# Patient Record
Sex: Female | Born: 1975 | Hispanic: Yes | Marital: Married | Smoking: Never smoker
Health system: Southern US, Community
[De-identification: ages and names within clinical notes are randomized; demographics above are authoritative.]

## PROBLEM LIST (undated history)

## (undated) DIAGNOSIS — D649 Anemia, unspecified: Secondary | ICD-10-CM

## (undated) DIAGNOSIS — F32A Depression, unspecified: Secondary | ICD-10-CM

## (undated) HISTORY — DX: Anemia, unspecified: D64.9

## (undated) HISTORY — DX: Depression, unspecified: F32.A

---

## 2004-10-22 ENCOUNTER — Inpatient Hospital Stay: Payer: Self-pay | Admitting: Obstetrics and Gynecology

## 2004-12-08 ENCOUNTER — Ambulatory Visit: Payer: Self-pay | Admitting: Obstetrics and Gynecology

## 2005-02-25 ENCOUNTER — Ambulatory Visit: Payer: Self-pay | Admitting: Obstetrics and Gynecology

## 2005-05-06 ENCOUNTER — Ambulatory Visit: Payer: Self-pay | Admitting: Obstetrics and Gynecology

## 2005-09-16 ENCOUNTER — Inpatient Hospital Stay: Payer: Self-pay | Admitting: Obstetrics and Gynecology

## 2007-02-20 ENCOUNTER — Emergency Department: Payer: Self-pay | Admitting: Emergency Medicine

## 2007-11-15 ENCOUNTER — Ambulatory Visit: Payer: Self-pay | Admitting: Obstetrics and Gynecology

## 2007-11-23 ENCOUNTER — Emergency Department: Payer: Self-pay | Admitting: Emergency Medicine

## 2007-11-24 ENCOUNTER — Ambulatory Visit: Payer: Self-pay | Admitting: Emergency Medicine

## 2007-12-27 ENCOUNTER — Encounter: Payer: Self-pay | Admitting: Maternal & Fetal Medicine

## 2008-01-01 ENCOUNTER — Ambulatory Visit: Payer: Self-pay | Admitting: Obstetrics and Gynecology

## 2008-01-14 ENCOUNTER — Ambulatory Visit: Payer: Self-pay | Admitting: Obstetrics and Gynecology

## 2008-01-17 ENCOUNTER — Emergency Department: Payer: Self-pay | Admitting: Emergency Medicine

## 2008-01-30 ENCOUNTER — Inpatient Hospital Stay: Payer: Self-pay | Admitting: Obstetrics and Gynecology

## 2008-03-21 ENCOUNTER — Emergency Department: Payer: Self-pay | Admitting: Emergency Medicine

## 2008-03-25 ENCOUNTER — Inpatient Hospital Stay: Payer: Self-pay | Admitting: Obstetrics and Gynecology

## 2008-06-09 ENCOUNTER — Encounter: Payer: Self-pay | Admitting: Obstetrics and Gynecology

## 2008-06-30 ENCOUNTER — Encounter: Payer: Self-pay | Admitting: Maternal & Fetal Medicine

## 2008-07-01 ENCOUNTER — Encounter: Payer: Self-pay | Admitting: Maternal & Fetal Medicine

## 2012-12-16 ENCOUNTER — Emergency Department: Payer: Self-pay | Admitting: Emergency Medicine

## 2012-12-16 LAB — URINALYSIS, COMPLETE
Bacteria: NONE SEEN
Bilirubin,UR: NEGATIVE
Glucose,UR: NEGATIVE mg/dL (ref 0–75)
Ketone: NEGATIVE
Leukocyte Esterase: NEGATIVE
Nitrite: NEGATIVE
RBC,UR: <1 /HPF (ref 0–5)
Specific Gravity: 1.006 (ref 1.003–1.030)
Squamous Epithelial: NONE SEEN
WBC UR: <1 /HPF (ref 0–5)

## 2012-12-16 LAB — CBC
HCT: 35.1 % (ref 35.0–47.0)
HGB: 11.5 g/dL — ABNORMAL LOW (ref 12.0–16.0)
MCHC: 32.7 g/dL (ref 32.0–36.0)
MCV: 90 fL (ref 80–100)
RBC: 3.9 10*6/uL (ref 3.80–5.20)
RDW: 14.5 % (ref 11.5–14.5)

## 2012-12-16 LAB — HCG, QUANTITATIVE, PREGNANCY: Beta Hcg, Quant.: 72271 m[IU]/mL — ABNORMAL HIGH

## 2012-12-16 LAB — PROTIME-INR: INR: 0.9

## 2013-01-10 ENCOUNTER — Encounter: Payer: Self-pay | Admitting: Maternal and Fetal Medicine

## 2013-01-10 ENCOUNTER — Ambulatory Visit: Payer: Self-pay | Admitting: Obstetrics and Gynecology

## 2013-01-10 LAB — HEMOGLOBIN: HGB: 9.9 g/dL — ABNORMAL LOW (ref 12.0–16.0)

## 2013-01-10 LAB — URINALYSIS, COMPLETE
Glucose,UR: NEGATIVE mg/dL (ref 0–75)
Ketone: NEGATIVE
Nitrite: NEGATIVE
Ph: 5 (ref 4.5–8.0)
Specific Gravity: 1.02 (ref 1.003–1.030)
Squamous Epithelial: 7
WBC UR: 53 /HPF (ref 0–5)

## 2013-01-18 ENCOUNTER — Ambulatory Visit: Payer: Self-pay | Admitting: Obstetrics and Gynecology

## 2013-01-21 LAB — PATHOLOGY REPORT

## 2013-06-14 ENCOUNTER — Inpatient Hospital Stay: Payer: Self-pay | Admitting: Obstetrics and Gynecology

## 2013-06-14 LAB — CBC WITH DIFFERENTIAL/PLATELET
Basophil #: 0 10*3/uL (ref 0.0–0.1)
Basophil %: 0.3 %
Eosinophil #: 0.2 10*3/uL (ref 0.0–0.7)
HGB: 11.1 g/dL — ABNORMAL LOW (ref 12.0–16.0)
Lymphocyte #: 2.5 10*3/uL (ref 1.0–3.6)
Lymphocyte %: 20.5 %
MCH: 32.5 pg (ref 26.0–34.0)
MCHC: 35 g/dL (ref 32.0–36.0)
MCV: 93 fL (ref 80–100)
Monocyte #: 0.7 x10 3/mm (ref 0.2–0.9)
Monocyte %: 5.7 %
Neutrophil #: 8.8 10*3/uL — ABNORMAL HIGH (ref 1.4–6.5)
Neutrophil %: 72.1 %
RBC: 3.43 10*6/uL — ABNORMAL LOW (ref 3.80–5.20)
WBC: 12.2 10*3/uL — ABNORMAL HIGH (ref 3.6–11.0)

## 2013-06-15 LAB — CREATININE, SERUM
Creatinine: 0.52 mg/dL — ABNORMAL LOW (ref 0.60–1.30)
EGFR (African American): >60
EGFR (Non-African Amer.): >60

## 2014-10-31 HISTORY — PX: BREAST BIOPSY: SHX20

## 2014-11-27 ENCOUNTER — Ambulatory Visit: Payer: Self-pay

## 2014-12-17 ENCOUNTER — Ambulatory Visit: Payer: Self-pay

## 2014-12-29 ENCOUNTER — Ambulatory Visit: Payer: Self-pay

## 2015-02-20 NOTE — Op Note (Signed)
PATIENT NAME:  Melanie Dunlap, Melanie Dunlap MR#:  [id number] DATE OF BIRTH:  [birth year]  DATE OF PROCEDURE:  01/18/2013  PREOPERATIVE DIAGNOSES:  1.  Incompetent cervix. 2.  [redacted] week gestation.   POSTOPERATIVE DIAGNOSES:  1.  Incompetent cervix. 2.  [redacted] week gestation.  3.  Cervical polyp.   PROCEDURES:  1.  Cervical polypectomy.  2.  Prophylactic cerclage.   SURGEON: Ricky L. Amalia Hailey, MD  ANESTHESIA: General orotracheal.  FINDINGS: Softened patulous cervix.  An approximately 1 cm x 1.5 cm fleshy mass that appeared to be a cervical polyp. Good hemostasis and cosmesis.   ESTIMATED BLOOD LOSS: 125.   DRAINS: Red rubber for approximately 300 mL of urine at the end of the case.   PROCEDURE IN DETAIL: The patient has a known history of decreased cervical incompetence with fetal loss. Planned prophylactic cerclage. Ultrasound done this week showed what appeared to be a well grown infant with a cervical length in excess of 3 cm.    Consent was signed, taken to the operating room and placed in the supine position where anesthesia was initiated.  She was prepped and draped in the usual sterile fashion, after being placed in the dorsal lithotomy position. The cervix was visualized and there appeared to be a bulbous mass. Careful bimanual exam showed that this area appeared to be a polyp that is attached to a stalk approximately mid cervix.   Endoloop of 0 chromic was asked for and when the polyp was grasped with ring forceps the tip ripped free. The rest of the polyp was removed bluntly and pressure was held at the base until bleeding subsided.   With the aid of weighted speculum and peon retractors I was able to place cerclage using #5 Mersilene band, in the usual fashion, tied at 12 o'clock, taking care that no suture was within the cervical canal and that the suture was not over tightened.   The area where the polyp had been removed was visualized, still had some minimal oozing, therefore  AstrinGyn was placed.  At this point, the bladder was drained, the cervix was observed again and seen to be hemostatic, and the procedure was felt to have achieved maximum efficacy. Instrument, needle and sponge counts were correct. The patient was returned to the supine position and left to the care of anesthesia.   Fetal heart tones were auscultated preoperatively; we will auscultate them postoperatively as well, and when the patient is sufficiently recovered will allow her to go home and will follow up in the office in the next 2 weeks, sooner if she has issues.  ____________________________ Rockey Situ. Amalia Hailey, MD rle:sb D: 01/18/2013 08:09:08 ET T: 01/18/2013 09:07:20 ET JOB#: [id number]  cc: Audry Pili L. Amalia Hailey, MD, <Dictator> Selmer Dominion MD ELECTRONICALLY SIGNED 01/19/2013 13:28

## 2015-02-23 LAB — SURGICAL PATHOLOGY

## 2015-03-10 NOTE — H&P (Signed)
L&D Evaluation:  History:  HPI 39yo [id number] with PNC at [facility] with cervical incompetence and cerclage placed, Progesterone injections weekly, +Antiphospholipid antiboidies on Lovenox 30 mg BID SQ, AMA, Recurrent miscarriages x 3. Pt seen today and found to have cervix with tension on the site necessitating removal of the cerclage. Initial assessment of the cx was 2cm's with the cerclage transversing the middle of the cx canal opening. Eval by Dr, Ouida Sills and cut the cerclage and then cx found to be 4/100/vtx. Sent to [facility] for probable delivery and Antibiotics for unknown GBS. No ROM, VB or decreased FM. Pt has had 2 BMZ injections during her pregnany.   Presents with back pain, contractions   Patient's Medical History Cervical incompetence, Recurrent miscarriage, +antiphosolipid syndrome   Medications Lovenox 30 mg bid (did not take this am)   Allergies NKDA   Social History 6th drade education, Catholic, married, no tobacco, no ETOH or rec drugs   Family History Non-Contributory   ROS:  ROS All systems were reviewed.  HEENT, CNS, GI, GU, Respiratory, CV, Renal and Musculoskeletal systems were found to be normal.   Exam:  Vital Signs stable   General no apparent distress   Mental Status clear   Chest clear   Heart normal sinus rhythm, no murmur/gallop/rubs   Abdomen gravid, non-tender   Estimated Fetal Weight Average for gestational age   Fetal Position vtx   Back no CVAT   Edema 1+   Reflexes 1+   Clonus negative   Pelvic 4/100/vtx   Mebranes Intact   FHT normal rate with no decels   Ucx regular, q 5 mins   Skin dry   Lymph no lymphadenopathy   Plan:  Plan monitor contractions and for cervical change, antibiotics for GBBS prophylaxis, GBS unknown   Comments Antic SVD. Will AROM when 2nd dose of Antibiotics in.   Electronic Signatures: Catheryn Bacon (CNM)  (Signed 15-Aug-14 12:46)  Authored: L&D Evaluation   Last Updated:  15-Aug-14 12:46 by Catheryn Bacon (CNM)

## 2016-11-28 ENCOUNTER — Emergency Department: Payer: Managed Care, Other (non HMO)

## 2016-11-28 ENCOUNTER — Encounter: Payer: Self-pay | Admitting: Emergency Medicine

## 2016-11-28 ENCOUNTER — Emergency Department
Admission: EM | Admit: 2016-11-28 | Discharge: 2016-11-28 | Disposition: A | Discharge status: Home / Self Care | Payer: Managed Care, Other (non HMO) | Attending: Emergency Medicine | Admitting: Emergency Medicine

## 2016-11-28 DIAGNOSIS — R519 Headache, unspecified: Secondary | ICD-10-CM

## 2016-11-28 DIAGNOSIS — M5412 Radiculopathy, cervical region: Secondary | ICD-10-CM | POA: Diagnosis not present

## 2016-11-28 DIAGNOSIS — R51 Headache: Secondary | ICD-10-CM

## 2016-11-28 MED ORDER — BUTALBITAL-APAP-CAFFEINE 50-325-40 MG PO TABS: 1.0000 | ORAL_TABLET | Freq: Four times a day (QID) | ORAL | 0 refills | Status: AC | PRN

## 2016-11-28 MED ORDER — TRAMADOL HCL 50 MG PO TABS
50.0000 mg | ORAL_TABLET | Freq: Once | ORAL | Status: AC
Start: 2016-11-28 — End: 2016-11-28
  Administered 2016-11-28: 50 mg via ORAL
  Filled 2016-11-28: qty 1

## 2016-11-28 MED ORDER — METHYLPREDNISOLONE SODIUM SUCC 125 MG IJ SOLR
125.0000 mg | Freq: Once | INTRAMUSCULAR | Status: AC
  Administered 2016-11-28: 125 mg via INTRAMUSCULAR
  Filled 2016-11-28: qty 2

## 2016-11-28 MED ORDER — METHYLPREDNISOLONE 4 MG PO TBPK: ORAL_TABLET | ORAL | 0 refills | Status: DC

## 2016-11-28 NOTE — ED Triage Notes (Signed)
Pt from [facility] with left arm numbness and headache x2 weeks, pt A/O at front desk.

## 2016-11-28 NOTE — ED Notes (Signed)
See triage note states she developed headache abut 2 weeks ago  Some nausea no fever  Has been taking OTC tylenol/asa with relief but then headache returns   Also having pain to both ears

## 2016-11-28 NOTE — ED Triage Notes (Signed)
Headache and bilateral earache x 2 weeks, denies numbness.

## 2016-11-28 NOTE — ED Provider Notes (Signed)
[facility] Emergency Department Provider Note   ____________________________________________   First MD Initiated Contact with Patient 11/28/16 1050     (approximate)  I have reviewed the triage vital signs and the nursing notes.   HISTORY  Chief Complaint Headache    HPI Melanie Dunlap is a 41 y.o. female patient complain of headache, bilateral earache and intermittent numbness to the right upper extremity for 2 weeks. Patient was sent from the family clinic today secondary to increased right arm numbness. Patient denies any vision disturbance but state there is transit vertigo. Patient stated mild nausea but no vomiting. Patient state mild relief taking over-the-counter Tylenol. Patient rates her pain discomfort as a 7/10. Patient states had migraine-type headaches 2 years ago. Patient states this does not feel like a migraine headache. Patient has not taken any medicine for migraine headaches in over a year.   History reviewed. No pertinent past medical history.  There are no active problems to display for this patient.   History reviewed. No pertinent surgical history.  Prior to Admission medications   Medication Sig Start Date End Date Taking? Authorizing Provider  butalbital-acetaminophen-caffeine (FIORICET, ESGIC) [phone] MG tablet Take 1-2 tablets by mouth every 6 (six) hours as needed for headache. 11/28/16 11/28/17  Sable Feil, PA-C  methylPREDNISolone (MEDROL DOSEPAK) 4 MG TBPK tablet Take Tapered dose as directed tomorrow morning 11/28/16   Sable Feil, PA-C    Allergies Patient has no known allergies.  No family history on file.  Social History Social History  Substance Use Topics  . Smoking status: Never Smoker  . Smokeless tobacco: Not on file  . Alcohol use Not on file    Review of Systems Constitutional: No fever/chills Eyes: No visual changes. ENT: No sore throat. Bilateral earache Cardiovascular: Denies  chest pain. Respiratory: Denies shortness of breath. Gastrointestinal: No abdominal pain.  No nausea, no vomiting.  No diarrhea.  No constipation. Genitourinary: Negative for dysuria. Musculoskeletal: Negative for back pain. Skin: Negative for rash. Neurological: Positive for headaches, focal weakness or numbness. _____________   PHYSICAL EXAM:  VITAL SIGNS: ED Triage Vitals  Enc Vitals Group     BP 11/28/16 1010 (!) 133/97     Pulse Rate 11/28/16 1010 91     Resp 11/28/16 1010 20     Temp 11/28/16 1010 98.3 F (36.8 C)     Temp Source 11/28/16 1010 Oral     SpO2 11/28/16 1010 99 %     Weight 11/28/16 1011 161 lb (73 kg)     Height 11/28/16 1011 5\' 6"  (1.676 m)     Head Circumference --      Peak Flow --      Pain Score 11/28/16 1011 7     Pain Loc --      Pain Edu? --      Excl. in [place]? --     Constitutional: Alert and oriented. Well appearing and in no acute distress. Eyes: Conjunctivae are normal. PERRL. EOMI. Head: Atraumatic. Nose: No congestion/rhinnorhea. Mouth/Throat: Mucous membranes are moist.  Oropharynx non-erythematous. Neck: No stridor.  No cervical spine tenderness to palpation. Hematological/Lymphatic/Immunilogical: No cervical lymphadenopathy. Cardiovascular: Normal rate, regular rhythm. Grossly normal heart sounds.  Good peripheral circulation. Respiratory: Normal respiratory effort.  No retractions. Lungs CTAB. Gastrointestinal: Soft and nontender. No distention. No abdominal bruits. No CVA tenderness. Musculoskeletal: No lower extremity tenderness nor edema.  No joint effusions. Neurologic:  Normal speech and language. No gross focal neurologic  deficits are appreciated. No gait instability. Skin:  Skin is warm, dry and intact. No rash noted. Psychiatric: Mood and affect are normal. Speech and behavior are normal.  ____________________________________________   LABS (all labs ordered are listed, but only abnormal results are displayed)  Labs  Reviewed - No data to display ____________________________________________  EKG   ____________________________________________  RADIOLOGY  No acute findings on CT of the head and cervical spine x-ray. ____________________________________________   PROCEDURES  Procedure(s) performed: None  Procedures  Critical Care performed: No  ____________________________________________   INITIAL IMPRESSION / ASSESSMENT AND PLAN / ED COURSE  Pertinent labs & imaging results that were available during my care of the patient were reviewed by me and considered in my medical decision making (see chart for details).  Tension headache and cervical radiculopathy to the right upper extremity. Discussed negative findings with imaging. Patient given discharge Instructions. Patient advised follow-up family clinic if no improvement in 3 days. Patient given prescription for Medrol Dosepak and Esgic.      ____________________________________________   FINAL CLINICAL IMPRESSION(S) / ED DIAGNOSES  Final diagnoses:  Bad headache  Cervical radiculitis      NEW MEDICATIONS STARTED DURING THIS VISIT:  New Prescriptions   BUTALBITAL-ACETAMINOPHEN-CAFFEINE (FIORICET, ESGIC) 50-325-40 MG TABLET    Take 1-2 tablets by mouth every 6 (six) hours as needed for headache.   METHYLPREDNISOLONE (MEDROL DOSEPAK) 4 MG TBPK TABLET    Take Tapered dose as directed tomorrow morning     Note:  This document was prepared using Dragon voice recognition software and may include unintentional dictation errors.    Sable Feil, PA-C 11/28/16 [place], MD 11/28/16 [phone]

## 2016-12-15 ENCOUNTER — Emergency Department: Payer: Managed Care, Other (non HMO)

## 2016-12-15 ENCOUNTER — Encounter: Payer: Self-pay | Admitting: *Deleted

## 2016-12-15 ENCOUNTER — Emergency Department
Admission: EM | Admit: 2016-12-15 | Discharge: 2016-12-15 | Disposition: A | Discharge status: Home / Self Care | Payer: Managed Care, Other (non HMO) | Attending: Emergency Medicine | Admitting: Emergency Medicine

## 2016-12-15 DIAGNOSIS — Y9389 Activity, other specified: Secondary | ICD-10-CM | POA: Insufficient documentation

## 2016-12-15 DIAGNOSIS — Y999 Unspecified external cause status: Secondary | ICD-10-CM | POA: Diagnosis not present

## 2016-12-15 DIAGNOSIS — S0990XA Unspecified injury of head, initial encounter: Secondary | ICD-10-CM | POA: Diagnosis present

## 2016-12-15 DIAGNOSIS — M542 Cervicalgia: Secondary | ICD-10-CM | POA: Diagnosis not present

## 2016-12-15 DIAGNOSIS — Y92410 Unspecified street and highway as the place of occurrence of the external cause: Secondary | ICD-10-CM | POA: Insufficient documentation

## 2016-12-15 MED ORDER — TRAMADOL HCL 50 MG PO TABS
50.0000 mg | ORAL_TABLET | Freq: Once | ORAL | Status: AC
  Administered 2016-12-15: 50 mg via ORAL
  Filled 2016-12-15: qty 1

## 2016-12-15 MED ORDER — TRAMADOL HCL 50 MG PO TABS: 50.0000 mg | ORAL_TABLET | Freq: Four times a day (QID) | ORAL | 0 refills | Status: AC | PRN
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
## 2016-12-15 NOTE — ED Notes (Signed)
D/c done through interpreter

## 2018-02-15 ENCOUNTER — Other Ambulatory Visit: Payer: Self-pay | Admitting: Obstetrics and Gynecology

## 2018-02-15 DIAGNOSIS — D242 Benign neoplasm of left breast: Secondary | ICD-10-CM

## 2018-02-27 ENCOUNTER — Ambulatory Visit
Admission: RE | Admit: 2018-02-27 | Discharge: 2018-02-27 | Disposition: A | Discharge status: Home / Self Care | Payer: 59 | Source: Ambulatory Visit | Attending: Obstetrics and Gynecology | Admitting: Obstetrics and Gynecology

## 2018-02-27 DIAGNOSIS — D242 Benign neoplasm of left breast: Secondary | ICD-10-CM | POA: Diagnosis not present

## 2018-02-27 DIAGNOSIS — R921 Mammographic calcification found on diagnostic imaging of breast: Secondary | ICD-10-CM | POA: Insufficient documentation

## 2018-03-06 ENCOUNTER — Other Ambulatory Visit: Payer: Self-pay | Admitting: Physician Assistant

## 2018-03-06 DIAGNOSIS — M25561 Pain in right knee: Secondary | ICD-10-CM

## 2018-05-23 ENCOUNTER — Encounter: Payer: Self-pay | Admitting: Emergency Medicine

## 2018-05-23 ENCOUNTER — Other Ambulatory Visit: Payer: Self-pay

## 2018-05-23 ENCOUNTER — Ambulatory Visit
Admission: EM | Admit: 2018-05-23 | Discharge: 2018-05-23 | Disposition: A | Discharge status: Home / Self Care | Payer: 59 | Attending: Family Medicine | Admitting: Family Medicine

## 2018-05-23 DIAGNOSIS — R0789 Other chest pain: Secondary | ICD-10-CM | POA: Diagnosis not present

## 2018-05-23 DIAGNOSIS — Z7982 Long term (current) use of aspirin: Secondary | ICD-10-CM | POA: Insufficient documentation

## 2018-05-23 DIAGNOSIS — M940 Chondrocostal junction syndrome [Tietze]: Secondary | ICD-10-CM

## 2018-05-23 DIAGNOSIS — Z79899 Other long term (current) drug therapy: Secondary | ICD-10-CM | POA: Insufficient documentation

## 2018-05-23 DIAGNOSIS — R079 Chest pain, unspecified: Secondary | ICD-10-CM | POA: Insufficient documentation

## 2018-05-23 DIAGNOSIS — Z9889 Other specified postprocedural states: Secondary | ICD-10-CM | POA: Insufficient documentation

## 2018-05-23 MED ORDER — CYCLOBENZAPRINE HCL 5 MG PO TABS: 5.0000 mg | ORAL_TABLET | Freq: Every day | ORAL | 0 refills | Status: DC

## 2018-05-23 NOTE — ED Triage Notes (Signed)
Patient states that she took 3 baby aspirin tablets soon after she started having chest pain around 4:30pm today

## 2018-05-23 NOTE — ED Triage Notes (Signed)
Patient c/o chest pain and left arm pain that started around 4:30pm while she was at work.  Patient reports some SOB.

## 2018-05-23 NOTE — ED Provider Notes (Signed)
[facility]    CSN: [id number] Arrival date & time: 05/23/18  1722     History   Chief Complaint Chief Complaint  Patient presents with  . Chest Pain    HPI Melanie Dunlap is a 42 y.o. female.   42 yo female presents with a c/o left upper chest "sharp", "stabbing" pain episode at 4:30pm today while she was at work. States pain was worse with movement of her arm and with deep breaths. States pain is now resolved. Currently not having pain, shortness of breath or other symptoms. Denies any recent illnesses, cough, fevers, chills, recent travel, prolonged immobilization, surgeries, injuries. Patient does not have any cardiac risk factors.   The history is provided by the patient.  Chest Pain    History reviewed. No pertinent past medical history.  There are no active problems to display for this patient.   Past Surgical History:  Procedure Laterality Date  . BREAST BIOPSY Left 2016   core bx    OB History   None      Home Medications    Prior to Admission medications   Medication Sig Start Date End Date Taking? Authorizing Provider  aspirin 325 MG tablet Take by mouth.    [provider]  butalbital-acetaminophen-caffeine (FIORICET, ESGIC) 50-325-40 MG tablet TAKE 1 TABLET BY MOUTH DAILY AS NEEDED FOR SEVERE HEADACHES 02/26/18   [provider]  cyclobenzaprine (FLEXERIL) 5 MG tablet Take 1 tablet (5 mg total) by mouth at bedtime. 05/23/18   Norval Gable, MD  gabapentin (NEURONTIN) 100 MG capsule TAKE 1 CAPSULE BY MOUTH 2 TIMES A DAY 05/15/18   [provider]    Family History Family History  Problem Relation Age of Onset  . Breast cancer Neg Hx     Social History Social History   Tobacco Use  . Smoking status: Never Smoker  . Smokeless tobacco: Never Used  Substance Use Topics  . Alcohol use: Never    Frequency: Never  . Drug use: Not on file     Allergies   Patient has no known  allergies.   Review of Systems Review of Systems  Cardiovascular: Positive for chest pain.     Physical Exam Triage Vital Signs ED Triage Vitals  Enc Vitals Group     BP 05/23/18 1734 114/71     Pulse Rate 05/23/18 1734 90     Resp 05/23/18 1734 14     Temp 05/23/18 1734 98.4 F (36.9 C)     Temp Source 05/23/18 1734 Oral     SpO2 05/23/18 1734 99 %     Weight 05/23/18 1731 168 lb (76.2 kg)     Height 05/23/18 1731 5\' 4"  (1.626 m)     Head Circumference --      Peak Flow --      Pain Score 05/23/18 1730 5     Pain Loc --      Pain Edu? --      Excl. in [place]? --    No data found.  Updated Vital Signs BP 114/71 (BP Location: Right Arm)   Pulse 90   Temp 98.4 F (36.9 C) (Oral)   Resp 14   Ht 5\' 4"  (1.626 m)   Wt 168 lb (76.2 kg)   LMP 05/10/2018 (Exact Date)   SpO2 99%   BMI 28.84 kg/m   Visual Acuity Right Eye Distance:   Left Eye Distance:   Bilateral Distance:    Right Eye  Near:   Left Eye Near:    Bilateral Near:     Physical Exam  Constitutional: She appears well-developed and well-nourished. No distress.  HENT:  Head: Normocephalic and atraumatic.  Right Ear: Tympanic membrane and ear canal normal.  Left Ear: Tympanic membrane and ear canal normal.  Nose: Mucosal edema and rhinorrhea present. No nose lacerations, sinus tenderness, nasal deformity, septal deviation or nasal septal hematoma. No epistaxis.  No foreign bodies. Right sinus exhibits maxillary sinus tenderness and frontal sinus tenderness. Left sinus exhibits maxillary sinus tenderness and frontal sinus tenderness.  Mouth/Throat: Uvula is midline and mucous membranes are normal.  Neck: Normal range of motion. Neck supple. No JVD present. No tracheal deviation present. No thyromegaly present.  Cardiovascular: Normal rate, regular rhythm, normal heart sounds and intact distal pulses. Exam reveals no friction rub.  No murmur heard. Pulmonary/Chest: Effort normal and breath sounds normal. No  stridor. No respiratory distress. She has no wheezes. She has no rales. She exhibits tenderness (reproducible; left chest wall).  Lymphadenopathy:    She has no cervical adenopathy.  Skin: No rash noted. She is not diaphoretic.  Nursing note and vitals reviewed.    UC Treatments / Results  Labs (all labs ordered are listed, but only abnormal results are displayed) Labs Reviewed - No data to display  EKG None  Radiology No results found.  Procedures ED EKG Date/Time: 05/23/2018 6:14 PM Performed by: Norval Gable, MD Authorized by: Norval Gable, MD   ECG reviewed by ED Physician in the absence of a cardiologist: yes   Previous ECG:    Previous ECG:  Unavailable Interpretation:    Interpretation: normal   Rate:    ECG rate:  92   ECG rate assessment: normal   Rhythm:    Rhythm: sinus rhythm   Ectopy:    Ectopy: none   QRS:    QRS axis:  Normal Conduction:    Conduction: normal   ST segments:    ST segments:  Normal T waves:    T waves: normal     (including critical care time)  Medications Ordered in UC Medications - No data to display  Initial Impression / Assessment and Plan / UC Course  I have reviewed the triage vital signs and the nursing notes.  Pertinent labs & imaging results that were available during my care of the patient were reviewed by me and considered in my medical decision making (see chart for details).      Final Clinical Impressions(s) / UC Diagnoses   Final diagnoses:  Chest wall pain  Costochondritis    ED Prescriptions    Medication Sig Dispense Auth. Provider   cyclobenzaprine (FLEXERIL) 5 MG tablet Take 1 tablet (5 mg total) by mouth at bedtime. 30 tablet Rishab Stoudt, Linward Foster, MD     1. ekg results and diagnosis reviewed with patient 2. rx as per orders above; reviewed possible side effects, interactions, risks and benefits  3. Recommend supportive treatment with rest, ice, otc anti-inflammatories 4. Follow-up prn if  symptoms worsen or don't improve   Controlled Substance Prescriptions [place] Controlled Substance Registry consulted? Not Applicable   Norval Gable, MD 05/23/18 [phone]

## 2018-10-25 ENCOUNTER — Ambulatory Visit
Admission: EM | Admit: 2018-10-25 | Discharge: 2018-10-25 | Disposition: A | Discharge status: Home / Self Care | Payer: 59 | Attending: Family Medicine | Admitting: Family Medicine

## 2018-10-25 ENCOUNTER — Encounter: Payer: Self-pay | Admitting: Emergency Medicine

## 2018-10-25 DIAGNOSIS — J101 Influenza due to other identified influenza virus with other respiratory manifestations: Secondary | ICD-10-CM | POA: Diagnosis not present

## 2018-10-25 LAB — RAPID INFLUENZA A&B ANTIGENS
Influenza A (ARMC): POSITIVE — AB
Influenza B (ARMC): NEGATIVE

## 2018-10-25 MED ORDER — OSELTAMIVIR PHOSPHATE 75 MG PO CAPS: 75.0000 mg | ORAL_CAPSULE | Freq: Two times a day (BID) | ORAL | 0 refills | Status: DC

## 2018-10-25 NOTE — ED Provider Notes (Signed)
[facility]    CSN: [id number] Arrival date & time: 10/25/18  1251     History   Chief Complaint Chief Complaint  Patient presents with  . Chills    HPI Melanie Dunlap is a 42 y.o. female.    URI  Presenting symptoms: congestion, cough and fever   Severity:  Moderate Onset quality:  Sudden Duration:  2 days Timing:  Constant Progression:  Worsening Chronicity:  New Relieved by:  Nothing Ineffective treatments:  OTC medications Associated symptoms: headaches and myalgias   Associated symptoms: no wheezing   Risk factors: sick contacts     History reviewed. No pertinent past medical history.  There are no active problems to display for this patient.   Past Surgical History:  Procedure Laterality Date  . BREAST BIOPSY Left 2016   core bx    OB History   No obstetric history on file.      Home Medications    Prior to Admission medications   Medication Sig Start Date End Date Taking? Authorizing Provider  aspirin 325 MG tablet Take by mouth.   Yes [provider]  butalbital-acetaminophen-caffeine (FIORICET, ESGIC) 50-325-40 MG tablet TAKE 1 TABLET BY MOUTH DAILY AS NEEDED FOR SEVERE HEADACHES 02/26/18   [provider]  cyclobenzaprine (FLEXERIL) 5 MG tablet Take 1 tablet (5 mg total) by mouth at bedtime. 05/23/18   Norval Gable, MD  gabapentin (NEURONTIN) 100 MG capsule TAKE 1 CAPSULE BY MOUTH 2 TIMES A DAY 05/15/18   [provider]  oseltamivir (TAMIFLU) 75 MG capsule Take 1 capsule (75 mg total) by mouth 2 (two) times daily. 10/25/18   Norval Gable, MD    Family History Family History  Problem Relation Age of Onset  . Breast cancer Neg Hx     Social History Social History   Tobacco Use  . Smoking status: Never Smoker  . Smokeless tobacco: Never Used  Substance Use Topics  . Alcohol use: Never    Frequency: Never  . Drug use: Not on file     Allergies   Patient has no known  allergies.   Review of Systems Review of Systems  Constitutional: Positive for fever.  HENT: Positive for congestion.   Respiratory: Positive for cough. Negative for wheezing.   Musculoskeletal: Positive for myalgias.  Neurological: Positive for headaches.     Physical Exam Triage Vital Signs ED Triage Vitals  Enc Vitals Group     BP 10/25/18 1327 (!) 129/91     Pulse Rate 10/25/18 1327 96     Resp 10/25/18 1327 18     Temp 10/25/18 1327 98.5 F (36.9 C)     Temp src --      SpO2 10/25/18 1327 99 %     Weight 10/25/18 1325 164 lb (74.4 kg)     Height 10/25/18 1325 5\' 4"  (1.626 m)     Head Circumference --      Peak Flow --      Pain Score 10/25/18 1325 7     Pain Loc --      Pain Edu? --      Excl. in [place]? --    No data found.  Updated Vital Signs BP (!) 129/91 (BP Location: Left Arm)   Pulse 96   Temp 98.5 F (36.9 C)   Resp 18   Ht 5\' 4"  (1.626 m)   Wt 74.4 kg   LMP 10/09/2018 (Exact Date)   SpO2 99%  BMI 28.15 kg/m   Visual Acuity Right Eye Distance:   Left Eye Distance:   Bilateral Distance:    Right Eye Near:   Left Eye Near:    Bilateral Near:     Physical Exam Vitals signs and nursing note reviewed.  Constitutional:      General: She is not in acute distress.    Appearance: She is ill-appearing. She is not toxic-appearing or diaphoretic.  Cardiovascular:     Rate and Rhythm: Normal rate and regular rhythm.  Pulmonary:     Effort: Pulmonary effort is normal. No respiratory distress.     Breath sounds: Normal breath sounds. No stridor. No wheezing, rhonchi or rales.  Neurological:     Mental Status: She is alert.      UC Treatments / Results  Labs (all labs ordered are listed, but only abnormal results are displayed) Labs Reviewed  RAPID INFLUENZA A&B ANTIGENS ([place]) - Abnormal; Notable for the following components:      Result Value   Influenza A ([facility]) POSITIVE (*)    All other components within normal limits     EKG None  Radiology No results found.  Procedures Procedures (including critical care time)  Medications Ordered in UC Medications - No data to display  Initial Impression / Assessment and Plan / UC Course  I have reviewed the triage vital signs and the nursing notes.  Pertinent labs & imaging results that were available during my care of the patient were reviewed by me and considered in my medical decision making (see chart for details).      Final Clinical Impressions(s) / UC Diagnoses   Final diagnoses:  Influenza A    ED Prescriptions    Medication Sig Dispense Auth. Provider   oseltamivir (TAMIFLU) 75 MG capsule Take 1 capsule (75 mg total) by mouth 2 (two) times daily. 10 capsule Norval Gable, MD       1. Lab results and diagnosis reviewed with patient 2. rx as per orders above; reviewed possible side effects, interactions, risks and benefits  3. Recommend supportive treatment with rest, fluids, otc analgesics prn  4. Follow-up prn if symptoms worsen or don't improve   Controlled Substance Prescriptions [place] Controlled Substance Registry consulted? Not Applicable   Norval Gable, MD 10/25/18 1416

## 2018-10-25 NOTE — ED Triage Notes (Signed)
Patient started with chills, fever and body aches on 12/23

## 2019-05-06 ENCOUNTER — Other Ambulatory Visit: Payer: Self-pay | Admitting: Obstetrics and Gynecology

## 2019-08-30 ENCOUNTER — Other Ambulatory Visit: Payer: Self-pay | Admitting: Obstetrics and Gynecology

## 2019-08-30 DIAGNOSIS — Z1231 Encounter for screening mammogram for malignant neoplasm of breast: Secondary | ICD-10-CM

## 2019-11-18 ENCOUNTER — Ambulatory Visit
Admission: RE | Admit: 2019-11-18 | Discharge: 2019-11-18 | Disposition: A | Discharge status: Home / Self Care | Payer: BC Managed Care – PPO | Source: Ambulatory Visit | Attending: Obstetrics and Gynecology | Admitting: Obstetrics and Gynecology

## 2019-11-18 DIAGNOSIS — Z1231 Encounter for screening mammogram for malignant neoplasm of breast: Secondary | ICD-10-CM | POA: Diagnosis present

## 2019-12-02 DIAGNOSIS — U071 COVID-19: Secondary | ICD-10-CM

## 2019-12-02 HISTORY — DX: COVID-19: U07.1

## 2019-12-25 ENCOUNTER — Other Ambulatory Visit: Payer: Self-pay

## 2019-12-25 ENCOUNTER — Encounter: Payer: Self-pay | Admitting: Emergency Medicine

## 2019-12-25 ENCOUNTER — Ambulatory Visit
Admission: EM | Admit: 2019-12-25 | Discharge: 2019-12-25 | Disposition: A | Discharge status: Home / Self Care | Payer: BC Managed Care – PPO | Attending: Family Medicine | Admitting: Family Medicine

## 2019-12-25 DIAGNOSIS — Z20822 Contact with and (suspected) exposure to covid-19: Secondary | ICD-10-CM

## 2019-12-25 DIAGNOSIS — U071 COVID-19: Secondary | ICD-10-CM | POA: Diagnosis not present

## 2019-12-25 MED ORDER — IPRATROPIUM BROMIDE 0.06 % NA SOLN: 2.0000 | Freq: Four times a day (QID) | NASAL | 0 refills | Status: DC | PRN

## 2019-12-25 NOTE — ED Provider Notes (Signed)
[facility]    CSN: [id number] Arrival date & time: 12/25/19  0932      History   Chief Complaint Chief Complaint  Patient presents with  . Nasal Congestion   HPI  44 year old female presents with congestion, loss of smell, runny nose.  Patient reports that her symptoms started on Saturday.  She reports runny nose, congestion, and loss of smell.  She had an exposure to an individual with COVID-19 last week.  She is concerned that she has COVID-19.  No fever.  No known exacerbating relieving factors.  No other reported symptoms.  No other complaints or concerns at this time.  Past Surgical History:  Procedure Laterality Date  . BREAST BIOPSY Left 2016   core bx    OB History   No obstetric history on file.      Home Medications    Prior to Admission medications   Medication Sig Start Date End Date Taking? Authorizing Provider  aspirin 325 MG tablet Take by mouth.   Yes [provider]  ipratropium (ATROVENT) 0.06 % nasal spray Place 2 sprays into both nostrils 4 (four) times daily as needed for rhinitis. 12/25/19   Coral Spikes, DO  gabapentin (NEURONTIN) 100 MG capsule TAKE 1 CAPSULE BY MOUTH 2 TIMES A DAY 05/15/18 12/25/19  [provider]    Family History Family History  Problem Relation Age of Onset  . Breast cancer Neg Hx     Social History Social History   Tobacco Use  . Smoking status: Never Smoker  . Smokeless tobacco: Never Used  Substance Use Topics  . Alcohol use: Never  . Drug use: Never     Allergies   Patient has no known allergies.   Review of Systems Review of Systems  Constitutional: Negative for fever.  HENT: Positive for congestion and rhinorrhea.    Physical Exam Triage Vital Signs ED Triage Vitals  Enc Vitals Group     BP 12/25/19 0955 (!) 124/94     Pulse Rate 12/25/19 0955 87     Resp 12/25/19 0955 18     Temp 12/25/19 0955 98.3 F (36.8 C)     Temp Source 12/25/19 0955 Oral     SpO2  12/25/19 0955 99 %     Weight 12/25/19 0955 170 lb (77.1 kg)     Height 12/25/19 0955 5\' 4"  (1.626 m)     Head Circumference --      Peak Flow --      Pain Score 12/25/19 1025 0     Pain Loc --      Pain Edu? --      Excl. in [place]? --    Updated Vital Signs BP (!) 124/94 (BP Location: Right Arm)   Pulse 87   Temp 98.3 F (36.8 C) (Oral)   Resp 18   Ht 5\' 4"  (1.626 m)   Wt 77.1 kg   LMP 12/20/2019   SpO2 99%   BMI 29.18 kg/m   Visual Acuity Right Eye Distance:   Left Eye Distance:   Bilateral Distance:    Right Eye Near:   Left Eye Near:    Bilateral Near:     Physical Exam Constitutional:      General: She is not in acute distress.    Appearance: Normal appearance. She is not ill-appearing.  HENT:     Head: Normocephalic and atraumatic.     Nose: No rhinorrhea.     Mouth/Throat:  Pharynx: Oropharynx is clear. No posterior oropharyngeal erythema.  Eyes:     General:        Right eye: No discharge.        Left eye: No discharge.     Conjunctiva/sclera: Conjunctivae normal.  Cardiovascular:     Rate and Rhythm: Normal rate and regular rhythm.     Heart sounds: No murmur.  Pulmonary:     Effort: Pulmonary effort is normal.     Breath sounds: Normal breath sounds. No wheezing, rhonchi or rales.  Neurological:     Mental Status: She is alert.  Psychiatric:        Mood and Affect: Mood normal.        Behavior: Behavior normal.     UC Treatments / Results  Labs (all labs ordered are listed, but only abnormal results are displayed) Labs Reviewed  NOVEL CORONAVIRUS, NAA (HOSP ORDER, SEND-OUT TO REF LAB; TAT 18-24 HRS)    EKG   Radiology No results found.  Procedures Procedures (including critical care time)  Medications Ordered in UC Medications - No data to display  Initial Impression / Assessment and Plan / UC Course  I have reviewed the triage vital signs and the nursing notes.  Pertinent labs & imaging results that were available during my  care of the patient were reviewed by me and considered in my medical decision making (see chart for details).    44 year old female presents with suspected COVID-19.  Atrovent nasal spray for congestion and rhinorrhea.  Awaiting test results.  Work note given.  Final Clinical Impressions(s) / UC Diagnoses   Final diagnoses:  Suspected COVID-19 virus infection     Discharge Instructions     Stay home.  Medication as directed.  Results available in 24 to 48 hours.  Take care  Dr. Lacinda Axon     ED Prescriptions    Medication Sig Dispense Auth. Provider   ipratropium (ATROVENT) 0.06 % nasal spray Place 2 sprays into both nostrils 4 (four) times daily as needed for rhinitis. 15 mL Coral Spikes, DO     PDMP not reviewed this encounter.   Coral Spikes, [place] 12/25/19 1131

## 2019-12-25 NOTE — ED Triage Notes (Addendum)
Patient c/o nasal congestion that started Sunday. She states last night she had loss of smell. Patient states she had a positive exposure to COVID last week.

## 2019-12-25 NOTE — Discharge Instructions (Signed)
Stay home.  Medication as directed.  Results available in 24 to 48 hours.  Take care  Dr. Lacinda Axon

## 2019-12-26 LAB — NOVEL CORONAVIRUS, NAA (HOSP ORDER, SEND-OUT TO REF LAB; TAT 18-24 HRS): SARS-CoV-2, NAA: DETECTED — AB

## 2019-12-30 ENCOUNTER — Telehealth (HOSPITAL_COMMUNITY): Payer: Self-pay | Admitting: Emergency Medicine

## 2019-12-30 NOTE — Telephone Encounter (Signed)
Your test for COVID-19 was positive, meaning that you were infected with the novel coronavirus and could give the germ to others.  Please continue isolation at home for at least 10 days since the start of your symptoms. If you do not have symptoms, please isolate at home for 10 days from the day you were tested. Once you complete your 10 day quarantine, you may return to normal activities as long as you've not had a fever for over 24 hours(without taking fever reducing medicine) and your symptoms are improving. Please continue good preventive care measures, including:  frequent hand-washing, avoid touching your face, cover coughs/sneezes, stay out of crowds and keep a 6 foot distance from others.  Go to the nearest hospital emergency room if fever/cough/breathlessness are severe or illness seems like a threat to life.  With spanish interpreter, left VM to return call.

## 2019-12-31 ENCOUNTER — Telehealth (HOSPITAL_COMMUNITY): Payer: Self-pay | Admitting: Emergency Medicine

## 2019-12-31 NOTE — Telephone Encounter (Signed)
With spanish interpreter, pt contacted and made aware, all questions answered.

## 2020-02-06 ENCOUNTER — Ambulatory Visit
Admission: EM | Admit: 2020-02-06 | Discharge: 2020-02-06 | Disposition: A | Discharge status: Home / Self Care | Payer: BC Managed Care – PPO | Attending: Internal Medicine | Admitting: Internal Medicine

## 2020-02-06 ENCOUNTER — Encounter: Payer: Self-pay | Admitting: Emergency Medicine

## 2020-02-06 ENCOUNTER — Other Ambulatory Visit: Payer: Self-pay

## 2020-02-06 DIAGNOSIS — H1013 Acute atopic conjunctivitis, bilateral: Secondary | ICD-10-CM | POA: Diagnosis not present

## 2020-02-06 MED ORDER — OLOPATADINE HCL 0.1 % OP SOLN: 1.0000 [drp] | Freq: Two times a day (BID) | OPHTHALMIC | 12 refills | Status: DC

## 2020-02-06 NOTE — ED Provider Notes (Signed)
[facility]    CSN: [id number] Arrival date & time: 02/06/20  1109      History   Chief Complaint Chief Complaint  Patient presents with  . Eye Problem    HPI Melanie Dunlap is a 44 y.o. female comes to urgent care with itching and pain in the left eye which started yesterday.  Patient denies any history of allergies.  No runny nose, itching of white to throat.  No rash on the skin.  Patient denies any history of asthma.  No trauma to the eye.  Patient has not tried any over-the-counter medications.  No redness to the eye.  No fever or chills.  Patient denies any blurred vision or double vision.   HPI  Past Medical History:  Diagnosis Date  . Lab test positive for detection of COVID-19 virus 12/2019    There are no problems to display for this patient.   Past Surgical History:  Procedure Laterality Date  . BREAST BIOPSY Left 2016   core bx    OB History   No obstetric history on file.      Home Medications    Prior to Admission medications   Medication Sig Start Date End Date Taking? Authorizing Provider  cyclobenzaprine (FLEXERIL) 10 MG tablet Take by mouth. 01/31/20 02/10/20 Yes [provider]  olopatadine (PATANOL) 0.1 % ophthalmic solution Place 1 drop into both eyes 2 (two) times daily. 02/06/20   Tyshell Ramberg, Myrene Galas, MD  gabapentin (NEURONTIN) 100 MG capsule TAKE 1 CAPSULE BY MOUTH 2 TIMES A DAY 05/15/18 12/25/19  [provider]  ipratropium (ATROVENT) 0.06 % nasal spray Place 2 sprays into both nostrils 4 (four) times daily as needed for rhinitis. 12/25/19 02/06/20  Coral Spikes, DO    Family History Family History  Problem Relation Age of Onset  . Breast cancer Neg Hx     Social History Social History   Tobacco Use  . Smoking status: Never Smoker  . Smokeless tobacco: Never Used  Substance Use Topics  . Alcohol use: Never  . Drug use: Never     Allergies   Patient has no known allergies.   Review of  Systems Review of Systems  Constitutional: Negative.   HENT: Negative.   Eyes: Positive for pain and itching. Negative for photophobia, discharge, redness and visual disturbance.  Respiratory: Negative.   Gastrointestinal: Negative.   Neurological: Negative for light-headedness and headaches.  Psychiatric/Behavioral: Negative.  Negative for confusion and decreased concentration.     Physical Exam Triage Vital Signs ED Triage Vitals  Enc Vitals Group     BP 02/06/20 1123 116/90     Pulse Rate 02/06/20 1123 84     Resp 02/06/20 1123 18     Temp 02/06/20 1123 98.8 F (37.1 C)     Temp Source 02/06/20 1123 Oral     SpO2 02/06/20 1123 97 %     Weight 02/06/20 1121 170 lb (77.1 kg)     Height 02/06/20 1121 5\' 4"  (1.626 m)     Head Circumference --      Peak Flow --      Pain Score 02/06/20 1121 6     Pain Loc --      Pain Edu? --      Excl. in [place]? --    No data found.  Updated Vital Signs BP 116/90 (BP Location: Right Arm)   Pulse 84   Temp 98.8 F (37.1 C) (Oral)  Resp 18   Ht 5\' 4"  (1.626 m)   Wt 77.1 kg   LMP 01/15/2020   SpO2 97%   BMI 29.18 kg/m   Visual Acuity Right Eye Distance:   Left Eye Distance:   Bilateral Distance:    Right Eye Near:   Left Eye Near:    Bilateral Near:     Physical Exam Vitals and nursing note reviewed.  Constitutional:      Appearance: Normal appearance.  HENT:     Right Ear: Tympanic membrane normal.     Left Ear: Tympanic membrane normal.  Eyes:     General:        Right eye: No discharge.        Left eye: No discharge.     Extraocular Movements: Extraocular movements intact.     Conjunctiva/sclera: Conjunctivae normal.     Pupils: Pupils are equal, round, and reactive to light.  Cardiovascular:     Rate and Rhythm: Normal rate and regular rhythm.     Pulses: Normal pulses.     Heart sounds: Normal heart sounds.  Pulmonary:     Breath sounds: Normal breath sounds.  Abdominal:     General: Bowel sounds are normal.      Palpations: Abdomen is soft.  Musculoskeletal:     Cervical back: Normal range of motion. No rigidity.  Lymphadenopathy:     Cervical: No cervical adenopathy.  Skin:    Capillary Refill: Capillary refill takes less than 2 seconds.  Neurological:     Mental Status: She is alert.      UC Treatments / Results  Labs (all labs ordered are listed, but only abnormal results are displayed) Labs Reviewed - No data to display  EKG   Radiology No results found.  Procedures Procedures (including critical care time)  Medications Ordered in UC Medications - No data to display  Initial Impression / Assessment and Plan / UC Course  I have reviewed the triage vital signs and the nursing notes.  Pertinent labs & imaging results that were available during my care of the patient were reviewed by me and considered in my medical decision making (see chart for details).     1.  Seasonal allergic conjunctivitis: Patanol eyedrops as needed Patient advised to return to urgent care if she develops worsening eye pain, double vision or blurry vision. Return precautions given If no improvement in symptoms patient may benefit from ophthalmology evaluation. Final Clinical Impressions(s) / UC Diagnoses   Final diagnoses:  Allergic conjunctivitis of both eyes   Discharge Instructions   None    ED Prescriptions    Medication Sig Dispense Auth. Provider   olopatadine (PATANOL) 0.1 % ophthalmic solution Place 1 drop into both eyes 2 (two) times daily. 5 mL Maryclaire Stoecker, Myrene Galas, MD     PDMP not reviewed this encounter.   Chase Picket, MD 02/06/20 [phone]

## 2020-02-06 NOTE — ED Triage Notes (Signed)
Patient c/o left eye pain and itching that started yesterday.

## 2021-09-01 ENCOUNTER — Other Ambulatory Visit: Payer: Self-pay | Admitting: Obstetrics and Gynecology

## 2021-09-01 DIAGNOSIS — Z1231 Encounter for screening mammogram for malignant neoplasm of breast: Secondary | ICD-10-CM

## 2021-12-03 ENCOUNTER — Other Ambulatory Visit: Payer: Self-pay

## 2021-12-03 ENCOUNTER — Ambulatory Visit
Admission: RE | Admit: 2021-12-03 | Discharge: 2021-12-03 | Disposition: A | Discharge status: Home / Self Care | Payer: BC Managed Care – PPO | Source: Ambulatory Visit | Attending: Obstetrics and Gynecology | Admitting: Obstetrics and Gynecology

## 2021-12-03 DIAGNOSIS — Z1231 Encounter for screening mammogram for malignant neoplasm of breast: Secondary | ICD-10-CM | POA: Insufficient documentation

## 2021-12-09 ENCOUNTER — Other Ambulatory Visit: Payer: Self-pay | Admitting: Obstetrics and Gynecology

## 2021-12-09 DIAGNOSIS — N6489 Other specified disorders of breast: Secondary | ICD-10-CM

## 2021-12-09 DIAGNOSIS — R928 Other abnormal and inconclusive findings on diagnostic imaging of breast: Secondary | ICD-10-CM

## 2021-12-21 ENCOUNTER — Ambulatory Visit
Admission: RE | Admit: 2021-12-21 | Discharge: 2021-12-21 | Disposition: A | Discharge status: Home / Self Care | Payer: BC Managed Care – PPO | Source: Ambulatory Visit | Attending: Obstetrics and Gynecology | Admitting: Obstetrics and Gynecology

## 2021-12-21 ENCOUNTER — Other Ambulatory Visit: Payer: Self-pay

## 2021-12-21 DIAGNOSIS — R928 Other abnormal and inconclusive findings on diagnostic imaging of breast: Secondary | ICD-10-CM | POA: Insufficient documentation

## 2021-12-21 DIAGNOSIS — N6489 Other specified disorders of breast: Secondary | ICD-10-CM | POA: Insufficient documentation

## 2022-06-28 ENCOUNTER — Other Ambulatory Visit: Payer: Self-pay | Admitting: Obstetrics and Gynecology

## 2022-06-28 DIAGNOSIS — N6489 Other specified disorders of breast: Secondary | ICD-10-CM

## 2022-07-26 ENCOUNTER — Ambulatory Visit
Admission: RE | Admit: 2022-07-26 | Discharge: 2022-07-26 | Disposition: A | Discharge status: Home / Self Care | Payer: BC Managed Care – PPO | Source: Ambulatory Visit | Attending: Obstetrics and Gynecology | Admitting: Obstetrics and Gynecology

## 2022-07-26 DIAGNOSIS — N6489 Other specified disorders of breast: Secondary | ICD-10-CM | POA: Diagnosis not present

## 2023-04-25 ENCOUNTER — Encounter: Payer: Self-pay | Admitting: Obstetrics and Gynecology

## 2023-05-15 ENCOUNTER — Other Ambulatory Visit: Payer: Self-pay | Admitting: Obstetrics and Gynecology

## 2023-05-15 DIAGNOSIS — N6489 Other specified disorders of breast: Secondary | ICD-10-CM

## 2023-05-25 ENCOUNTER — Ambulatory Visit
Admission: RE | Admit: 2023-05-25 | Discharge: 2023-05-25 | Disposition: A | Discharge status: Home / Self Care | Payer: BC Managed Care – PPO | Source: Ambulatory Visit | Attending: Obstetrics and Gynecology | Admitting: Obstetrics and Gynecology

## 2023-05-25 DIAGNOSIS — N6489 Other specified disorders of breast: Secondary | ICD-10-CM | POA: Insufficient documentation

## 2023-12-25 ENCOUNTER — Other Ambulatory Visit: Payer: Self-pay | Admitting: Family Medicine

## 2023-12-25 DIAGNOSIS — Z1231 Encounter for screening mammogram for malignant neoplasm of breast: Secondary | ICD-10-CM

## 2024-01-20 IMAGING — US US BREAST*L* LIMITED INC AXILLA
1 series · 5 of 5 positions shown · non-contrast
Comparison: Previous exam(s).

CLINICAL DATA: Patient recalled from screening for left breast
asymmetry.

EXAM:
DIGITAL DIAGNOSTIC UNILATERAL LEFT MAMMOGRAM WITH TOMOSYNTHESIS AND
CAD; ULTRASOUND LEFT BREAST LIMITED
TECHNIQUE: Left digital diagnostic mammography and breast tomosynthesis was
performed. The images were evaluated with computer-aided detection.;
Targeted ultrasound examination of the left breast was performed.

[Series 1: us breast*left* limited inc axilla · 0.06mm/px · 5 of 5 slices shown]
[im 1/5]
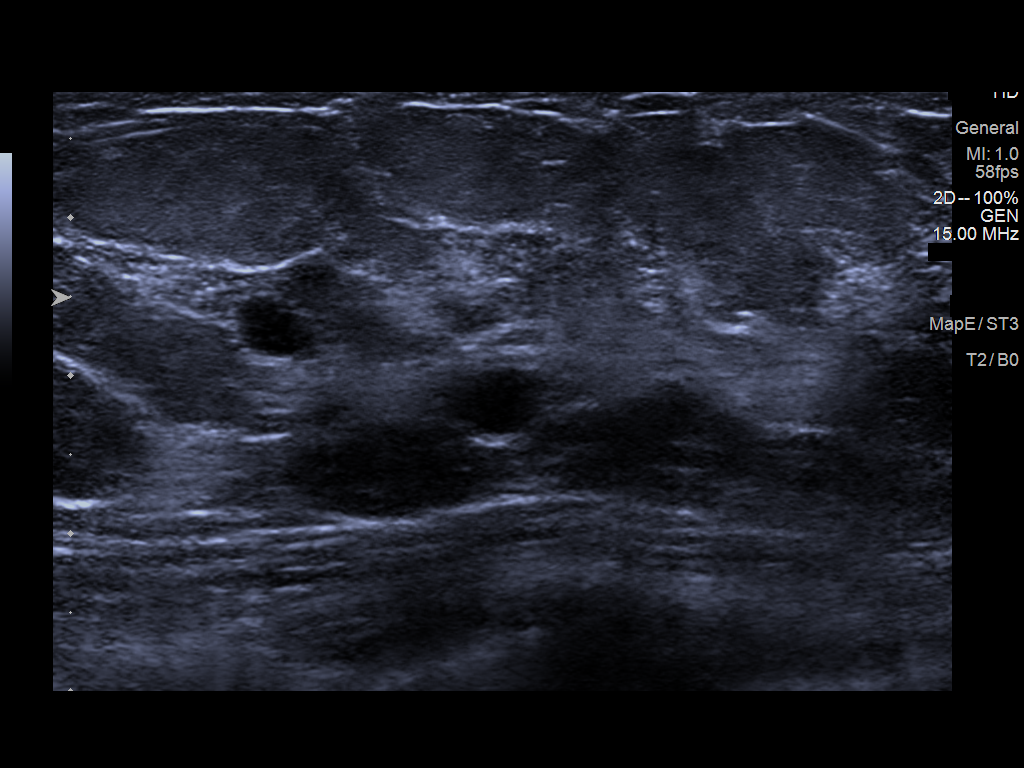
[im 2/5]
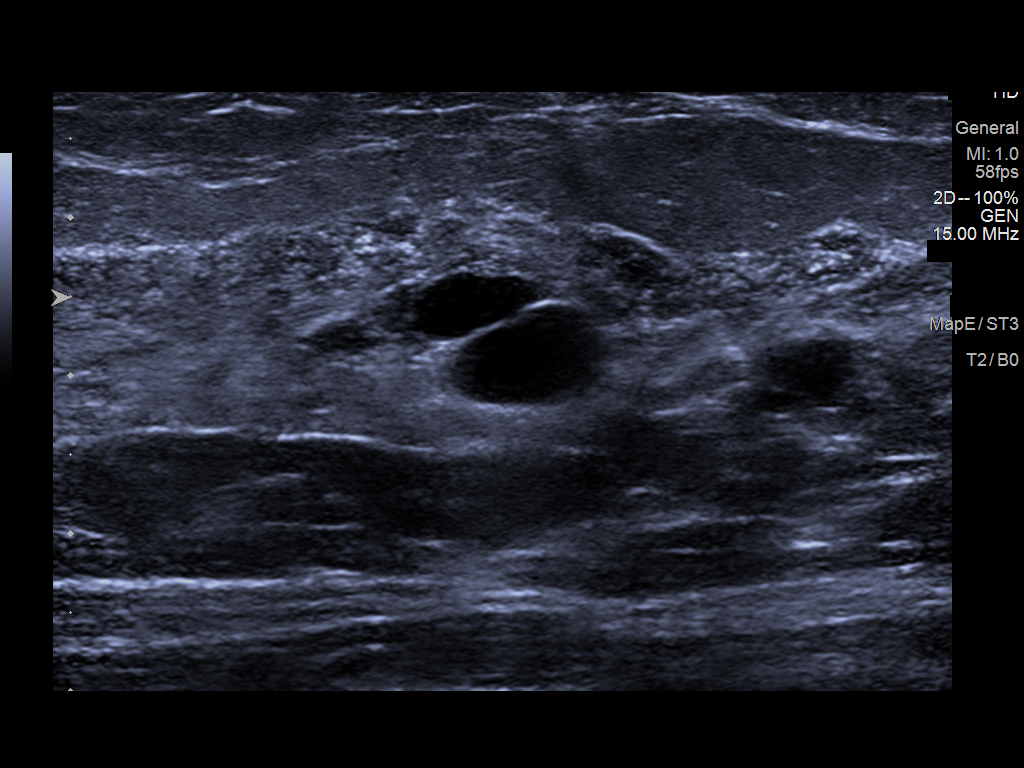
[im 3/5]
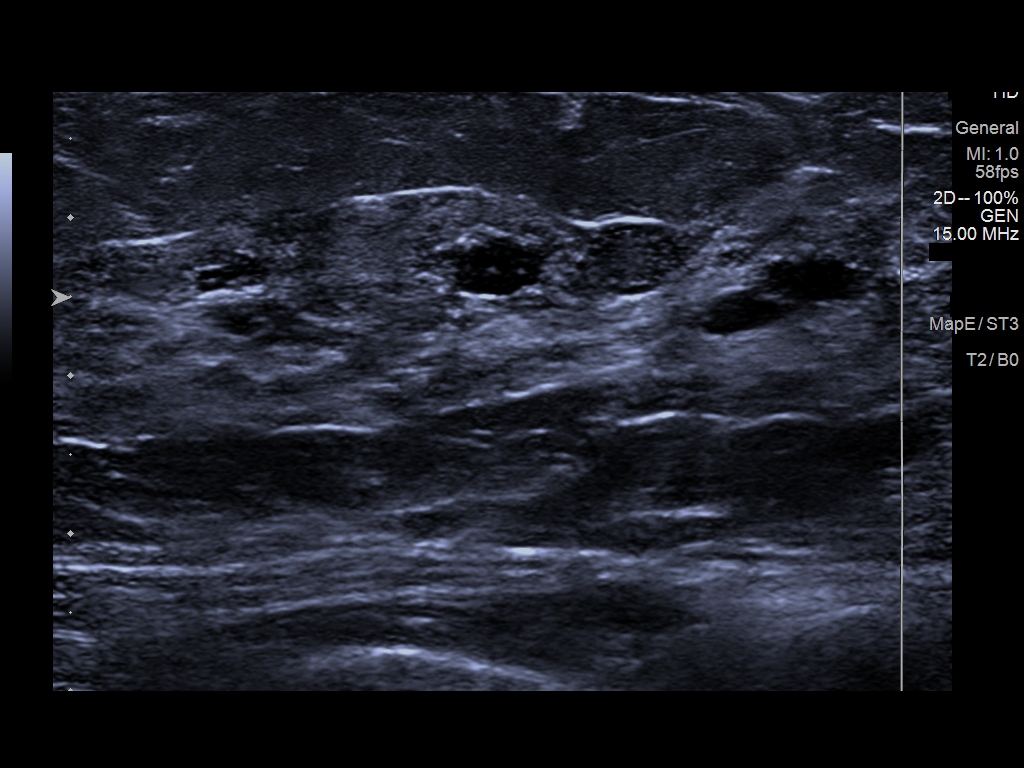
[im 4/5]
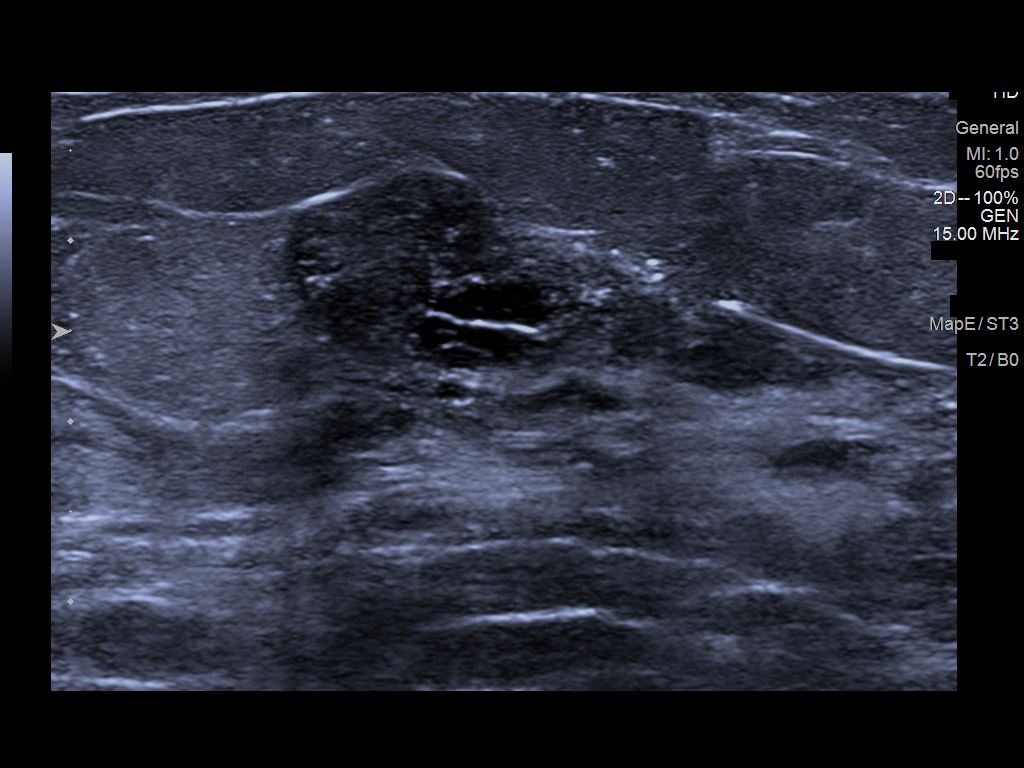
[im 5/5]
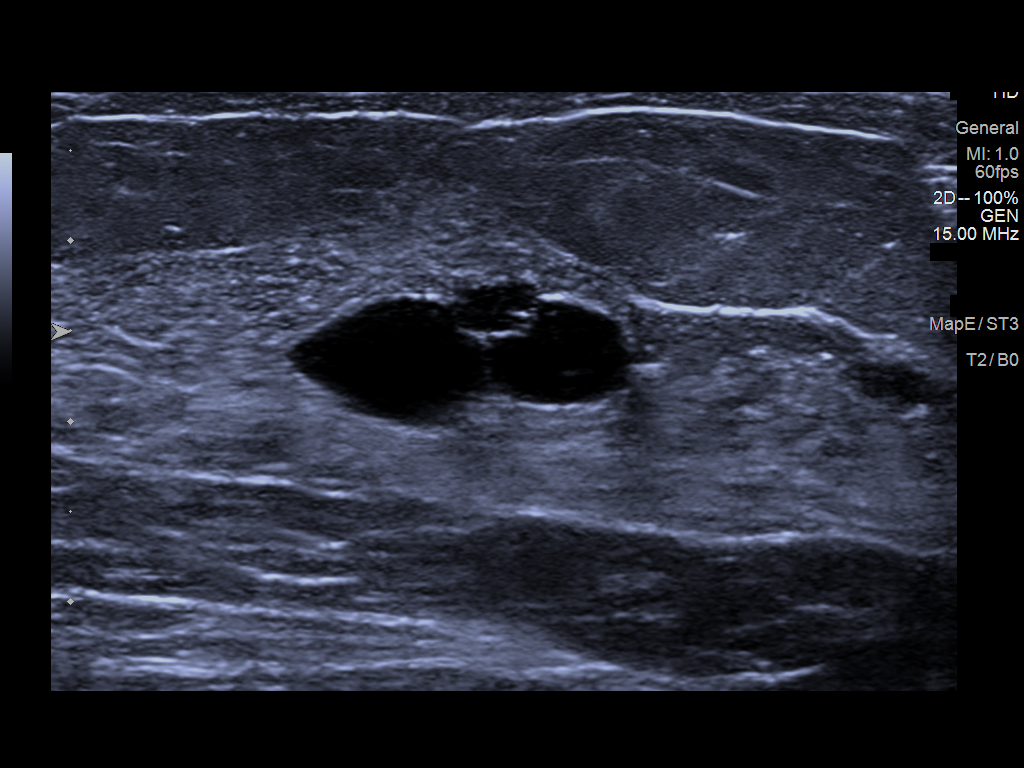

[5 of 5 positions shown; findings below may reference images not displayed]

ACR Breast Density Category c: The breast tissue is heterogeneously
dense, which may obscure small masses.
FINDINGS: Within the far posteromedial left breast there is a persistent focal
asymmetry, further evaluated with spot compression views.

Targeted ultrasound is performed, showing no definite discrete mass
within the far posterior upper inner left breast. Dense tissue is
visualized. Additionally, multiple cysts are demonstrated throughout
the upper inner left breast middle depth with the largest measuring
up to 1.8 cm.
IMPRESSION: Probably benign asymmetry upper inner left breast posterior depth.

Multiple cysts left breast.

RECOMMENDATION:
Left breast diagnostic mammogram in 6 months to reassess the
probably benign left breast asymmetry.

I have discussed the findings and recommendations with the patient.
If applicable, a reminder letter will be sent to the patient
regarding the next appointment.

BI-RADS CATEGORY  3: Probably benign.

## 2024-01-24 ENCOUNTER — Other Ambulatory Visit: Payer: Self-pay | Admitting: Internal Medicine

## 2024-01-24 DIAGNOSIS — D649 Anemia, unspecified: Secondary | ICD-10-CM | POA: Insufficient documentation

## 2024-01-26 ENCOUNTER — Ambulatory Visit: Admitting: Anesthesiology

## 2024-01-26 ENCOUNTER — Other Ambulatory Visit: Payer: Self-pay

## 2024-01-26 ENCOUNTER — Ambulatory Visit
Admission: RE | Admit: 2024-01-26 | Discharge: 2024-01-26 | Disposition: A | Discharge status: Home / Self Care | Attending: Gastroenterology | Admitting: Gastroenterology

## 2024-01-26 ENCOUNTER — Encounter
Admission: RE | Disposition: A | Discharge status: Home / Self Care | Payer: Self-pay | Source: Home / Self Care | Attending: Gastroenterology

## 2024-01-26 ENCOUNTER — Encounter: Payer: Self-pay | Admitting: *Deleted

## 2024-01-26 DIAGNOSIS — K641 Second degree hemorrhoids: Secondary | ICD-10-CM | POA: Diagnosis not present

## 2024-01-26 DIAGNOSIS — D509 Iron deficiency anemia, unspecified: Secondary | ICD-10-CM | POA: Insufficient documentation

## 2024-01-26 DIAGNOSIS — D122 Benign neoplasm of ascending colon: Secondary | ICD-10-CM | POA: Insufficient documentation

## 2024-01-26 DIAGNOSIS — K625 Hemorrhage of anus and rectum: Secondary | ICD-10-CM | POA: Diagnosis present

## 2024-01-26 DIAGNOSIS — K2950 Unspecified chronic gastritis without bleeding: Secondary | ICD-10-CM | POA: Insufficient documentation

## 2024-01-26 HISTORY — PX: COLONOSCOPY: SHX5424

## 2024-01-26 HISTORY — PX: POLYPECTOMY: SHX5525

## 2024-01-26 HISTORY — PX: ESOPHAGOGASTRODUODENOSCOPY: SHX5428

## 2024-01-26 SURGERY — COLONOSCOPY
Anesthesia: General

## 2024-01-26 MED ORDER — SODIUM CHLORIDE 0.9 % IV SOLN: INTRAVENOUS | Status: DC

## 2024-01-26 MED ORDER — LIDOCAINE HCL (CARDIAC) PF 100 MG/5ML IV SOSY
PREFILLED_SYRINGE | INTRAVENOUS | Status: DC | PRN
  Administered 2024-01-26: 40 mg via INTRAVENOUS
  Administered 2024-01-26: 100 mg via INTRAVENOUS
  Administered 2024-01-26: 60 mg via INTRAVENOUS

## 2024-01-26 MED ORDER — PROPOFOL 500 MG/50ML IV EMUL
INTRAVENOUS | Status: DC | PRN
  Administered 2024-01-26: 50 mg via INTRAVENOUS
  Administered 2024-01-26: 150 ug/kg/min via INTRAVENOUS

## 2024-01-26 MED ORDER — LIDOCAINE HCL (PF) 2 % IJ SOLN
INTRAMUSCULAR | Status: AC
  Filled 2024-01-26: qty 5

## 2024-01-26 MED ORDER — GLYCOPYRROLATE 0.2 MG/ML IJ SOLN
INTRAMUSCULAR | Status: AC
  Filled 2024-01-26: qty 1

## 2024-01-26 NOTE — Op Note (Signed)
 [facility] Patient Name: Melanie Dunlap Procedure Date: 01/26/2024 11:46 AM MRN: [id number] Account #: [id number] Date of Birth: 1976-10-26 Admit Type: Outpatient Age: 48 Room: [facility] ENDO ROOM 3 Gender: Female Note Status: Finalized Instrument Name: Upper Endoscope [id number] Procedure:             Upper GI endoscopy Indications:           Iron deficiency anemia Providers:             Eather Colas MD, MD Referring MD:          Eather Colas MD, Heloise Ochoa MD (Referring MD) Medicines:             Monitored Anesthesia Care Complications:         No immediate complications. Estimated blood loss:                         Minimal. Procedure:             Pre-Anesthesia Assessment:                        - Prior to the procedure, a History and Physical was                         performed, and patient medications and allergies were                         reviewed. The patient is competent. The risks and                         benefits of the procedure and the sedation options and                         risks were discussed with the patient. All questions                         were answered and informed consent was obtained.                         Patient identification and proposed procedure were                         verified by the physician, the nurse, the                         anesthesiologist, the anesthetist and the technician                         in the endoscopy suite. Mental Status Examination:                         alert and oriented. Airway Examination: normal                         oropharyngeal airway and neck mobility. Respiratory                         Examination: clear to auscultation. CV Examination:  normal. Prophylactic Antibiotics: The patient does not                         require prophylactic antibiotics. Prior                         Anticoagulants: The patient has taken no  anticoagulant                         or antiplatelet agents. ASA Grade Assessment: I - A                         normal, healthy patient. After reviewing the risks and                         benefits, the patient was deemed in satisfactory                         condition to undergo the procedure. The anesthesia                         plan was to use monitored anesthesia care (MAC).                         Immediately prior to administration of medications,                         the patient was re-assessed for adequacy to receive                         sedatives. The heart rate, respiratory rate, oxygen                         saturations, blood pressure, adequacy of pulmonary                         ventilation, and response to care were monitored                         throughout the procedure. The physical status of the                         patient was re-assessed after the procedure.                        After obtaining informed consent, the endoscope was                         passed under direct vision. Throughout the procedure,                         the patient's blood pressure, pulse, and oxygen                         saturations were monitored continuously. The Endoscope                         was introduced through the mouth, and advanced to the  second part of duodenum. The upper GI endoscopy was                         accomplished without difficulty. The patient tolerated                         the procedure well. Findings:      The examined esophagus was normal.      The entire examined stomach was normal. Biopsies were taken with a cold       forceps for Helicobacter pylori testing. Estimated blood loss was       minimal.      The examined duodenum was normal. Impression:            - Normal esophagus.                        - Normal stomach. Biopsied.                        - Normal examined duodenum. Recommendation:        -  Discharge patient to home.                        - Resume previous diet.                        - Continue present medications.                        - Await pathology results.                        - Return to referring physician as previously                         scheduled. Procedure Code(s):     --- Professional ---                        [id number], Esophagogastroduodenoscopy, flexible,                         transoral; with biopsy, single or multiple Diagnosis Code(s):     --- Professional ---                        D50.9, Iron deficiency anemia, unspecified CPT copyright 2022 American Medical Association. All rights reserved. The codes documented in this report are preliminary and upon coder review may  be revised to meet current compliance requirements. Eather Colas MD, MD 01/26/2024 12:25:30 PM Number of Addenda: 0 Note Initiated On: 01/26/2024 11:46 AM Estimated Blood Loss:  Estimated blood loss was minimal.      [facility]

## 2024-01-26 NOTE — Interval H&P Note (Signed)
 History and Physical Interval Note:  01/26/2024 11:49 AM  Melanie Dunlap  has presented today for surgery, with the diagnosis of Rectal Bleeding IDA Constiaption.  The various methods of treatment have been discussed with the patient and family. After consideration of risks, benefits and other options for treatment, the patient has consented to  Procedure(s) with comments: COLONOSCOPY (N/A) - SPANISH INTERPRETER EGD (ESOPHAGOGASTRODUODENOSCOPY) (N/A) as a surgical intervention.  The patient's history has been reviewed, patient examined, no change in status, stable for surgery.  I have reviewed the patient's chart and labs.  Questions were answered to the patient's satisfaction.     Regis Bill  Ok to proceed with EGD/Colonoscopy

## 2024-01-26 NOTE — H&P (Signed)
 Outpatient short stay form Pre-procedure 01/26/2024  Regis Bill, MD  Primary Physician: Randa Ngo, CNM  Reason for visit:  IDA/Rectal bleeding  History of present illness:    48 y/o lady with history of h. Pylori here for EGD/Colonoscopy for IDA and small volume hematochezia. No blood thinners. No family history of GI malignancies. No significant abdominal surgeries.    Current Facility-Administered Medications:    0.9 %  sodium chloride infusion, , Intravenous, Continuous, Thu Baggett, Rossie Muskrat, MD, Last Rate: 20 mL/hr at 01/26/24 1122, Restarted at 01/26/24 1145  Medications Prior to Admission  Medication Sig Dispense Refill Last Dose/Taking   olopatadine (PATANOL) 0.1 % ophthalmic solution Place 1 drop into both eyes 2 (two) times daily. 5 mL 12      No Known Allergies   Past Medical History:  Diagnosis Date   Lab test positive for detection of COVID-19 virus 12/2019    Review of systems:  Otherwise negative.    Physical Exam  Gen: Alert, oriented. Appears stated age.  HEENT: PERRLA. Lungs: No respiratory distress CV: RRR Abd: soft, benign, no masses Ext: No edema    Planned procedures: Proceed with EGD/colonoscopy. The patient understands the nature of the planned procedure, indications, risks, alternatives and potential complications including but not limited to bleeding, infection, perforation, damage to internal organs and possible oversedation/side effects from anesthesia. The patient agrees and gives consent to proceed.  Please refer to procedure notes for findings, recommendations and patient disposition/instructions.     Regis Bill, MD [facility]

## 2024-01-26 NOTE — Op Note (Signed)
 [facility] Patient Name: Melanie Dunlap Procedure Date: 01/26/2024 11:45 AM MRN: [id number] Account #: [id number] Date of Birth: 05-10-1976 Admit Type: Outpatient Age: 48 Room: [facility] ENDO ROOM 3 Gender: Female Note Status: Finalized Instrument Name: Nelda Marseille [id number] Procedure:             Colonoscopy Indications:           Rectal bleeding, Iron deficiency anemia Providers:             Eather Colas MD, MD Referring MD:          Eather Colas MD, Heloise Ochoa MD (Referring MD) Medicines:             Monitored Anesthesia Care Complications:         No immediate complications. Estimated blood loss:                         Minimal. Procedure:             Pre-Anesthesia Assessment:                        - Prior to the procedure, a History and Physical was                         performed, and patient medications and allergies were                         reviewed. The patient is competent. The risks and                         benefits of the procedure and the sedation options and                         risks were discussed with the patient. All questions                         were answered and informed consent was obtained.                         Patient identification and proposed procedure were                         verified by the physician, the nurse, the                         anesthesiologist, the anesthetist and the technician                         in the endoscopy suite. Mental Status Examination:                         alert and oriented. Airway Examination: normal                         oropharyngeal airway and neck mobility. Respiratory                         Examination: clear to auscultation. CV Examination:  normal. Prophylactic Antibiotics: The patient does not                         require prophylactic antibiotics. Prior                         Anticoagulants: The patient has  taken no anticoagulant                         or antiplatelet agents. ASA Grade Assessment: I - A                         normal, healthy patient. After reviewing the risks and                         benefits, the patient was deemed in satisfactory                         condition to undergo the procedure. The anesthesia                         plan was to use monitored anesthesia care (MAC).                         Immediately prior to administration of medications,                         the patient was re-assessed for adequacy to receive                         sedatives. The heart rate, respiratory rate, oxygen                         saturations, blood pressure, adequacy of pulmonary                         ventilation, and response to care were monitored                         throughout the procedure. The physical status of the                         patient was re-assessed after the procedure.                        After obtaining informed consent, the colonoscope was                         passed under direct vision. Throughout the procedure,                         the patient's blood pressure, pulse, and oxygen                         saturations were monitored continuously. The                         Colonoscope was introduced through the anus and  advanced to the the terminal ileum, with                         identification of the appendiceal orifice and IC                         valve. The colonoscopy was performed without                         difficulty. The patient tolerated the procedure well.                         The quality of the bowel preparation was good. The                         terminal ileum, ileocecal valve, appendiceal orifice,                         and rectum were photographed. Findings:      The perianal and digital rectal examinations were normal.      The terminal ileum appeared normal.      A 1 mm polyp was  found in the ascending colon. The polyp was sessile.       The polyp was removed with a jumbo cold forceps. Resection and retrieval       were complete. Estimated blood loss was minimal.      Internal hemorrhoids were found during retroflexion. The hemorrhoids       were Grade II (internal hemorrhoids that prolapse but reduce       spontaneously).      The exam was otherwise without abnormality on direct and retroflexion       views. Impression:            - The examined portion of the ileum was normal.                        - One 1 mm polyp in the ascending colon, removed with                         a jumbo cold forceps. Resected and retrieved.                        - Internal hemorrhoids.                        - The examination was otherwise normal on direct and                         retroflexion views. Recommendation:        - Discharge patient to home.                        - Resume previous diet.                        - Continue present medications.                        - Await pathology results.                        -  Repeat colonoscopy for surveillance based on                         pathology results.                        - Return to referring physician as previously                         scheduled. Procedure Code(s):     --- Professional ---                        [id number], Colonoscopy, flexible; with biopsy, single or                         multiple Diagnosis Code(s):     --- Professional ---                        K64.1, Second degree hemorrhoids                        D12.2, Benign neoplasm of ascending colon                        K62.5, Hemorrhage of anus and rectum                        D50.9, Iron deficiency anemia, unspecified CPT copyright 2022 American Medical Association. All rights reserved. The codes documented in this report are preliminary and upon coder review may  be revised to meet current compliance requirements. Eather Colas MD,  MD 01/26/2024 12:27:45 PM Number of Addenda: 0 Note Initiated On: 01/26/2024 11:45 AM Scope Withdrawal Time: 0 hours 8 minutes 24 seconds  Total Procedure Duration: 0 hours 12 minutes 35 seconds  Estimated Blood Loss:  Estimated blood loss was minimal.      [facility]

## 2024-01-26 NOTE — Transfer of Care (Signed)
 Immediate Anesthesia Transfer of Care Note  Patient: Melanie Dunlap  Procedure(s) Performed: COLONOSCOPY EGD (ESOPHAGOGASTRODUODENOSCOPY) POLYPECTOMY  Patient Location: PACU  Anesthesia Type:General  Level of Consciousness: awake and patient cooperative  Airway & Oxygen Therapy: Patient Spontanous Breathing and Patient connected to nasal cannula oxygen  Post-op Assessment: Report given to RN and Post -op Vital signs reviewed and stable  Post vital signs: stable  Last Vitals:  Vitals Value Taken Time  BP 97/66 01/26/24 1224  Temp 36 C 01/26/24 1226  Pulse 71 01/26/24 1226  Resp 18 01/26/24 1226  SpO2 100 % 01/26/24 1226  Vitals shown include unfiled device data.  Last Pain:  Vitals:   01/26/24 1226  TempSrc: Tympanic  PainSc: 0-No pain         Complications: No notable events documented.

## 2024-01-26 NOTE — Anesthesia Preprocedure Evaluation (Signed)
 Anesthesia Evaluation  Patient identified by MRN, date of birth, ID band Patient awake    Reviewed: Allergy & Precautions, NPO status , Patient's Chart, lab work & pertinent test results  Airway Mallampati: III  TM Distance: <3 FB Neck ROM: full    Dental  (+) Chipped   Pulmonary neg pulmonary ROS, neg shortness of breath   Pulmonary exam normal        Cardiovascular (-) angina negative cardio ROS Normal cardiovascular exam     Neuro/Psych negative neurological ROS  negative psych ROS   GI/Hepatic negative GI ROS, Neg liver ROS,neg GERD  ,,  Endo/Other  negative endocrine ROS    Renal/GU negative Renal ROS  negative genitourinary   Musculoskeletal   Abdominal   Peds  Hematology negative hematology ROS (+)   Anesthesia Other Findings Past Medical History: 12/2019: Lab test positive for detection of COVID-19 virus  Past Surgical History: 2016: BREAST BIOPSY; Left     Comment:  core bx     Reproductive/Obstetrics negative OB ROS                             Anesthesia Physical Anesthesia Plan  ASA: 1  Anesthesia Plan: General   Post-op Pain Management:    Induction: Intravenous  PONV Risk Score and Plan: Propofol infusion and TIVA  Airway Management Planned: Natural Airway and Nasal Cannula  Additional Equipment:   Intra-op Plan:   Post-operative Plan:   Informed Consent: I have reviewed the patients History and Physical, chart, labs and discussed the procedure including the risks, benefits and alternatives for the proposed anesthesia with the patient or authorized representative who has indicated his/her understanding and acceptance.     Dental Advisory Given and Interpreter used for interview  Plan Discussed with: Anesthesiologist, CRNA and Surgeon  Anesthesia Plan Comments: (Patient consented for risks of anesthesia including but not limited to:  - adverse  reactions to medications - risk of airway placement if required - damage to eyes, teeth, lips or other oral mucosa - nerve damage due to positioning  - sore throat or hoarseness - Damage to heart, brain, nerves, lungs, other parts of body or loss of life  Patient voiced understanding and assent.)       Anesthesia Quick Evaluation

## 2024-01-27 NOTE — Anesthesia Postprocedure Evaluation (Signed)
 Anesthesia Post Note  Patient: Melanie Dunlap  Procedure(s) Performed: COLONOSCOPY EGD (ESOPHAGOGASTRODUODENOSCOPY) POLYPECTOMY  Patient location during evaluation: Endoscopy Anesthesia Type: General Level of consciousness: awake and alert Pain management: pain level controlled Vital Signs Assessment: post-procedure vital signs reviewed and stable Respiratory status: spontaneous breathing, nonlabored ventilation, respiratory function stable and patient connected to nasal cannula oxygen Cardiovascular status: blood pressure returned to baseline and stable Postop Assessment: no apparent nausea or vomiting Anesthetic complications: no   No notable events documented.   Last Vitals:  Vitals:   01/26/24 1037 01/26/24 1226  BP: (!) 139/94   Pulse: 89   Resp: 18   Temp: 37 C (!) 36 C  SpO2: 97%     Last Pain:  Vitals:   01/26/24 1246  TempSrc:   PainSc: 0-No pain                 Melanie Dunlap

## 2024-01-29 ENCOUNTER — Other Ambulatory Visit: Payer: Self-pay | Admitting: *Deleted

## 2024-01-29 ENCOUNTER — Encounter: Payer: Self-pay | Admitting: Gastroenterology

## 2024-01-29 DIAGNOSIS — D649 Anemia, unspecified: Secondary | ICD-10-CM

## 2024-01-30 ENCOUNTER — Encounter: Payer: Self-pay | Admitting: Internal Medicine

## 2024-01-30 ENCOUNTER — Inpatient Hospital Stay: Attending: Internal Medicine

## 2024-01-30 ENCOUNTER — Inpatient Hospital Stay

## 2024-01-30 ENCOUNTER — Inpatient Hospital Stay (HOSPITAL_BASED_OUTPATIENT_CLINIC_OR_DEPARTMENT_OTHER): Admitting: Internal Medicine

## 2024-01-30 ENCOUNTER — Other Ambulatory Visit: Payer: Self-pay

## 2024-01-30 VITALS — BP 119/79 | HR 74 | Resp 14

## 2024-01-30 VITALS — BP 123/84 | HR 75 | Temp 96.0°F | Resp 14 | Wt 185.0 lb

## 2024-01-30 DIAGNOSIS — D649 Anemia, unspecified: Secondary | ICD-10-CM

## 2024-01-30 DIAGNOSIS — R5383 Other fatigue: Secondary | ICD-10-CM | POA: Diagnosis not present

## 2024-01-30 DIAGNOSIS — Z79899 Other long term (current) drug therapy: Secondary | ICD-10-CM | POA: Insufficient documentation

## 2024-01-30 DIAGNOSIS — D509 Iron deficiency anemia, unspecified: Secondary | ICD-10-CM | POA: Insufficient documentation

## 2024-01-30 DIAGNOSIS — N920 Excessive and frequent menstruation with regular cycle: Secondary | ICD-10-CM | POA: Diagnosis not present

## 2024-01-30 DIAGNOSIS — K5900 Constipation, unspecified: Secondary | ICD-10-CM | POA: Insufficient documentation

## 2024-01-30 DIAGNOSIS — K921 Melena: Secondary | ICD-10-CM | POA: Insufficient documentation

## 2024-01-30 DIAGNOSIS — R0602 Shortness of breath: Secondary | ICD-10-CM | POA: Insufficient documentation

## 2024-01-30 LAB — CMP (CANCER CENTER ONLY)
ALT: 16 U/L (ref 0–44)
AST: 16 U/L (ref 15–41)
Albumin: 4.1 g/dL (ref 3.5–5.0)
Alkaline Phosphatase: 74 U/L (ref 38–126)
Anion gap: 6 (ref 5–15)
BUN: 10 mg/dL (ref 6–20)
CO2: 22 mmol/L (ref 22–32)
Calcium: 8.9 mg/dL (ref 8.9–10.3)
Chloride: 106 mmol/L (ref 98–111)
Creatinine: 0.62 mg/dL (ref 0.44–1.00)
GFR, Estimated: >60 mL/min (ref >60)
Glucose, Bld: 89 mg/dL (ref 70–99)
Potassium: 3.8 mmol/L (ref 3.5–5.1)
Sodium: 134 mmol/L — ABNORMAL LOW (ref 135–145)
Total Bilirubin: 0.7 mg/dL (ref 0.0–1.2)
Total Protein: 7.6 g/dL (ref 6.5–8.1)

## 2024-01-30 LAB — CBC WITH DIFFERENTIAL (CANCER CENTER ONLY)
Abs Immature Granulocytes: 0.02 10*3/uL (ref 0.00–0.07)
Basophils Absolute: 0 10*3/uL (ref 0.0–0.1)
Basophils Relative: 0 %
Eosinophils Absolute: 0.1 10*3/uL (ref 0.0–0.5)
Eosinophils Relative: 1 %
HCT: 31.1 % — ABNORMAL LOW (ref 36.0–46.0)
Hemoglobin: 9.7 g/dL — ABNORMAL LOW (ref 12.0–15.0)
Immature Granulocytes: 0 %
Lymphocytes Relative: 39 %
Lymphs Abs: 3.2 10*3/uL (ref 0.7–4.0)
MCH: 25.3 pg — ABNORMAL LOW (ref 26.0–34.0)
MCHC: 31.2 g/dL (ref 30.0–36.0)
MCV: 81.2 fL (ref 80.0–100.0)
Monocytes Absolute: 0.7 10*3/uL (ref 0.1–1.0)
Monocytes Relative: 8 %
Neutro Abs: 4.1 10*3/uL (ref 1.7–7.7)
Neutrophils Relative %: 52 %
Platelet Count: 333 10*3/uL (ref 150–400)
RBC: 3.83 MIL/uL — ABNORMAL LOW (ref 3.87–5.11)
RDW: 23.2 % — ABNORMAL HIGH (ref 11.5–15.5)
WBC Count: 8.1 10*3/uL (ref 4.0–10.5)
nRBC: 0 % (ref 0.0–0.2)

## 2024-01-30 LAB — IRON AND TIBC
Iron: 31 ug/dL (ref 28–170)
Saturation Ratios: 7 % — ABNORMAL LOW (ref 10.4–31.8)
TIBC: 461 ug/dL — ABNORMAL HIGH (ref 250–450)
UIBC: 430 ug/dL

## 2024-01-30 LAB — SAMPLE TO BLOOD BANK

## 2024-01-30 LAB — FERRITIN: Ferritin: 7 ng/mL — ABNORMAL LOW (ref 11–307)

## 2024-01-30 LAB — SURGICAL PATHOLOGY

## 2024-01-30 MED ORDER — IRON SUCROSE 20 MG/ML IV SOLN
200.0000 mg | Freq: Once | INTRAVENOUS | Status: AC
  Administered 2024-01-30: 200 mg via INTRAVENOUS
  Filled 2024-01-30: qty 10

## 2024-01-30 NOTE — Assessment & Plan Note (Addendum)
#   Anemia- Hb-symptomatic [hb 8.1; ferritin- 8- ? FEB 2025- Dr.George].  Likely due to iron deficiency - from etiology GI blood loss/menorrhagia/malabsorption.  Poor tolerance- contipation/lack of improvement on oral iron.   #  Discussed regarding IV iron infusion/Venofer. Discussed the potential acute infusion reactions with IV iron; which are quite rare.  Patient understands the risk; will proceed with infusions.   # Recommend CBC CMP LDH; iron studies ferritin B12 folic acid; Urine analysis.  Urine pregnancy test.  # Hx of ? APS- [duing pregnacy- OB [facility]- No hematology]- s/p Lovenox- NO hx of DVT- not on anticoagulation. Will check work up at next visit-ordered today.  #Etiology of iron deficiency: Likely secondary menorrhagia; EGD colonoscopy March 2025 negative.  Rectal bleeding likely hemorrhoidal. Monitor for now.   Thank you Mr.Croley  for allowing me to participate in the care of your pleasant patient. Please do not hesitate to contact me with questions or concerns in the interim.  3/28- Negative urine preg test-  # DISPOSITION: # ADD iron studies/ferritin to labs today # venofer today # weekly venofer x 3 more # follow up 3  months  MD; labs- cbc/bmp; urine preg test- iron studies; ferritin-LDH-possible venofer- Dr.B

## 2024-01-30 NOTE — Progress Notes (Signed)
 [facility] CONSULT NOTE  Patient Care Team: McVey, Prudencio Pair, CNM as PCP - General (Obstetrics and Gynecology) Earna Coder, MD as Consulting Physician (Oncology)  CHIEF COMPLAINTS/PURPOSE OF CONSULTATION: ANEMIA   HEMATOLOGY HISTORY  # ANEMIA[Hb; MCV-platelets- WBC; Iron sat; ferritin;  GFR- CT/US- ;   HISTORY OF PRESENTING ILLNESS: Patient ambulating-independently. /Accompanied by family.   Melanie Dunlap 48 y.o.  female pleasant patient is  been referred to [place] for further evaluation of anemia.  Patient complains of shortness of breath with exertion.  Also complains of excessive fatigue.  Complains of dizziness especially on standing.    Patient hs a diagnosis IDA and 21-month hx of rectal bleeding. She had hemoglobin 7.8, MCV 78, ferritin 8. No previous labs prior to this for 4 years. Reports she had IDA with pregnancy 10 years ago requiring transfusion   Blood in stools: intermittent- likely hemorrhoidal; EGD/colonoscopy-March 2025- [Dr.Locklear]- WNL; NO capsule Blood in urine: none  Difficulty swallowing:none  Change of bowel movement/constipation:none  Prior blood transfusion:yes- few years ago.  Kidney/Liver disease: none  Alcohol: none  Bariatric surgery:none    Vaginal bleeding: heavy menstrual cycles- first 2 days.  Prior evaluation with hematology:none  Prior bone marrow biopsy: none  Oral iron: for 3 weeks- constipation.  Prior IV iron infusions: none   Review of Systems  Constitutional:  Positive for malaise/fatigue. Negative for chills, diaphoresis and fever.  HENT:  Negative for nosebleeds and sore throat.   Eyes:  Negative for double vision.  Respiratory:  Negative for cough, hemoptysis, sputum production, shortness of breath and wheezing.   Cardiovascular:  Negative for chest pain, palpitations, orthopnea and leg swelling.  Gastrointestinal:  Negative for abdominal pain, blood in stool, constipation, diarrhea, heartburn,  melena, nausea and vomiting.  Genitourinary:  Negative for dysuria, frequency and urgency.  Musculoskeletal:  Negative for back pain and joint pain.  Skin: Negative.  Negative for itching and rash.  Neurological:  Positive for dizziness. Negative for tingling, focal weakness, weakness and headaches.  Endo/Heme/Allergies:  Does not bruise/bleed easily.  Psychiatric/Behavioral:  Negative for depression. The patient is not nervous/anxious and does not have insomnia.      MEDICAL HISTORY:  Past Medical History:  Diagnosis Date   Anemia    Depression    Lab test positive for detection of COVID-19 virus 12/2019    SURGICAL HISTORY: Past Surgical History:  Procedure Laterality Date   BREAST BIOPSY Left 2016   core bx   COLONOSCOPY N/A 01/26/2024   Procedure: COLONOSCOPY;  Surgeon: Regis Bill, MD;  Location: [facility] ENDOSCOPY;  Service: Endoscopy;  Laterality: N/A;  SPANISH INTERPRETER   ESOPHAGOGASTRODUODENOSCOPY N/A 01/26/2024   Procedure: EGD (ESOPHAGOGASTRODUODENOSCOPY);  Surgeon: Regis Bill, MD;  Location[facility] ENDOSCOPY;  Service: Endoscopy;  Laterality: N/A;   POLYPECTOMY  01/26/2024   Procedure: POLYPECTOMY;  Surgeon: Regis Bill, MD;  Location: [facility] ENDOSCOPY;  Service: Endoscopy;;    SOCIAL HISTORY: Social History   Socioeconomic History   Marital status: Married    Spouse name: Not on file   Number of children: Not on file   Years of education: Not on file   Highest education level: Not on file  Occupational History   Not on file  Tobacco Use   Smoking status: Never   Smokeless tobacco: Never  Vaping Use   Vaping status: Never Used  Substance and Sexual Activity   Alcohol use: Never   Drug use: Never   Sexual activity: Yes  Other Topics Concern   Not on file  Social History Narrative   Not on file   Social Drivers of Health   Financial Resource Strain: Not on file  Food Insecurity: No Food Insecurity (01/30/2024)   Hunger Vital Sign     Worried About Running Out of Food in the Last Year: Never true    Ran Out of Food in the Last Year: Never true  Transportation Needs: No Transportation Needs (01/30/2024)   PRAPARE - Administrator, Civil Service (Medical): No    Lack of Transportation (Non-Medical): No  Physical Activity: Not on file  Stress: Not on file  Social Connections: Not on file  Intimate Partner Violence: Not At Risk (01/30/2024)   Humiliation, Afraid, Rape, and Kick questionnaire    Fear of Current or Ex-Partner: No    Emotionally Abused: No    Physically Abused: No    Sexually Abused: No    FAMILY HISTORY: Family History  Problem Relation Age of Onset   Breast cancer Neg Hx     ALLERGIES:  has no known allergies.  MEDICATIONS:  Current Outpatient Medications  Medication Sig Dispense Refill   citalopram (CELEXA) 20 MG tablet Take 20 mg by mouth daily.     hydrocortisone (ANUSOL-HC) 2.5 % rectal cream Apply topically 3 (three) times daily.     No current facility-administered medications for this visit.     PHYSICAL EXAMINATION:   Vitals:   01/30/24 1429  BP: 123/84  Pulse: 75  Resp: 14  Temp: (!) 96 F (35.6 C)  SpO2: 98%   Filed Weights   01/30/24 1429  Weight: 185 lb (83.9 kg)    Physical Exam Vitals and nursing note reviewed.  HENT:     Head: Normocephalic and atraumatic.     Mouth/Throat:     Pharynx: Oropharynx is clear.  Eyes:     Extraocular Movements: Extraocular movements intact.     Pupils: Pupils are equal, round, and reactive to light.  Cardiovascular:     Rate and Rhythm: Normal rate and regular rhythm.  Pulmonary:     Comments: Decreased breath sounds bilaterally.  Abdominal:     Palpations: Abdomen is soft.  Musculoskeletal:        General: Normal range of motion.     Cervical back: Normal range of motion.  Skin:    General: Skin is warm.  Neurological:     General: No focal deficit present.     Mental Status: She is alert and oriented to  person, place, and time.  Psychiatric:        Behavior: Behavior normal.        Judgment: Judgment normal.      LABORATORY DATA:  I have reviewed the data as listed Lab Results  Component Value Date   WBC 8.1 01/30/2024   HGB 9.7 (L) 01/30/2024   HCT 31.1 (L) 01/30/2024   MCV 81.2 01/30/2024   PLT 333 01/30/2024   Recent Labs    01/30/24 1404  NA 134*  K 3.8  CL 106  CO2 22  GLUCOSE 89  BUN 10  CREATININE 0.62  CALCIUM 8.9  GFRNONAA >60  PROT 7.6  ALBUMIN 4.1  AST 16  ALT 16  ALKPHOS 74  BILITOT 0.7     No results found.  ASSESSMENT & PLAN:   Symptomatic anemia # Anemia- Hb-symptomatic [hb 8.1; ferritin- 8- ? FEB 2025- Dr.George].  Likely due to iron deficiency - from etiology GI blood  loss/menorrhagia/malabsorption.  Poor tolerance- contipation/lack of improvement on oral iron.   #  Discussed regarding IV iron infusion/Venofer. Discussed the potential acute infusion reactions with IV iron; which are quite rare.  Patient understands the risk; will proceed with infusions.   # Recommend CBC CMP LDH; iron studies ferritin B12 folic acid; Urine analysis.  Urine pregnancy test.  # Hx of ? APS- [duing pregnacy- OB [facility]- No hematology]- s/p Lovenox- NO hx of DVT- not on anticoagulation. Will check work up at next visit-ordered today.  #Etiology of iron deficiency: Likely secondary menorrhagia; EGD colonoscopy March 2025 negative.  Rectal bleeding likely hemorrhoidal. Monitor for now.   Thank you Mr.Croley  for allowing me to participate in the care of your pleasant patient. Please do not hesitate to contact me with questions or concerns in the interim.  3/28- Negative urine preg test-  # DISPOSITION: # ADD iron studies/ferritin to labs today # venofer today # weekly venofer x 3 more # follow up 3  months  MD; labs- cbc/bmp; urine preg test- iron studies; ferritin-LDH-possible venofer- Dr.B    All questions were answered. The patient knows to call the clinic  with any problems, questions or concerns.    Earna Coder, MD 01/30/2024 3:23 PM

## 2024-02-06 ENCOUNTER — Inpatient Hospital Stay

## 2024-02-06 VITALS — BP 110/73 | HR 79 | Temp 98.0°F | Resp 17

## 2024-02-06 DIAGNOSIS — D509 Iron deficiency anemia, unspecified: Secondary | ICD-10-CM | POA: Diagnosis not present

## 2024-02-06 DIAGNOSIS — D649 Anemia, unspecified: Secondary | ICD-10-CM

## 2024-02-06 MED ORDER — IRON SUCROSE 20 MG/ML IV SOLN
200.0000 mg | Freq: Once | INTRAVENOUS | Status: AC
  Administered 2024-02-06: 200 mg via INTRAVENOUS
  Filled 2024-02-06: qty 10

## 2024-02-06 MED ORDER — SODIUM CHLORIDE 0.9% FLUSH
10.0000 mL | Freq: Once | INTRAVENOUS | Status: AC | PRN
  Administered 2024-02-06: 10 mL
  Filled 2024-02-06: qty 10

## 2024-02-06 NOTE — Progress Notes (Signed)
Patient tolerated Venofer infusion well. Explained recommendation of 30 min post monitoring. Patient refused to wait post monitoring. Educated on what signs to watch for & to call with any concerns. No questions, discharged. Stable  

## 2024-02-06 NOTE — Patient Instructions (Signed)
 Iron Sucrose Injection Qu es este medicamento? El HIERRO SACAROSA trata los niveles bajos de hierro (anemia por deficiencia de Company secretary) en personas con enfermedad renal. El hierro es un mineral que cumple una funcin importante en la produccin de glbulos rojos, que llevan el oxgeno de los pulmones al resto del cuerpo. Este medicamento puede ser utilizado para otros usos; si tiene alguna pregunta consulte con su proveedor de atencin mdica o con su farmacutico. MARCAS COMUNES: Venofer Qu le debo informar a mi profesional de la salud antes de tomar este medicamento? Necesitan saber si usted presenta alguno de los [employer] o situaciones: Anemia no causada por niveles bajos de hierro Careers information officer altos de hierro en la sangre Enfermedad renal Enfermedad heptica Una reaccin alrgica o inusual al hierro, a otros medicamentos, alimentos, colorantes o conservantes Si est embarazada o buscando quedar embarazada Si est amamantando a un beb Cmo debo Visual merchandiser medicamento? Este medicamento es para infusin en una vena. Su equipo de atencin lo [employer] en un hospital o en un entorno clnico. Hable con su equipo de atencin sobre el uso de este medicamento en nios. Aunque se puede recetar a nios tan pequeos como de 2 aos de edad con ciertas afecciones, existen precauciones que deben tomarse. Sobredosis: Pngase en contacto inmediatamente con un centro toxicolgico o una sala de urgencia si usted cree que haya tomado demasiado medicamento.<br>ATENCIN: [employer] es solo para usted. No comparta este medicamento con nadie. Qu sucede si me olvido de una dosis? Cumpla con las citas para dosis de seguimiento. Es importante no olvidar ninguna dosis. Llame a su equipo de atencin si no puede asistir a una cita. Qu puede interactuar con este medicamento? No use este medicamento con ninguno de los siguientes productos: Deferoxamina Dimercaprol Otros  productos con hierro Industrial/product designer tambin podra Product/process development scientist con los siguientes productos: Cloranfenicol Deferasirox Puede ser que esta lista no menciona todas las posibles interacciones. Informe a su profesional de [employer] de [employer] productos a base de hierbas, medicamentos de [place] o suplementos nutritivos que est tomando. Si usted fuma, consume bebidas alcohlicas o si utiliza drogas ilegales, indqueselo tambin a su profesional de [employer]. Algunas sustancias pueden interactuar con su medicamento. A qu debo estar atento al usar [employer]? Visite a su equipo de atencin para que revise su evolucin peridicamente. Informe a su equipo de atencin si los sntomas no comienzan a mejorar o si empeoran. Usted podra necesitar realizarse [employer] de sangre mientras est usando [place]. Es posible que deba comer ms alimentos que contienen hierro. Hable con su equipo de atencin. Los alimentos que contienen hierro incluyen granos o cereales enteros, frutas secas, legumbres, guisantes, verduras de hojas verdes y rganos internos (hgado, rin). Qu efectos secundarios puedo tener al [employer] este medicamento? Efectos secundarios que debe informar a su equipo de atencin tan pronto como sea posible: Reacciones alrgicas: erupcin cutnea, comezn/picazn, urticaria, hinchazn de la cara, los labios, la lengua o la garganta Presin arterial baja: mareo, sensacin de desmayo o aturdimiento, visin borrosa Falta de aliento Efectos secundarios que generalmente no requieren atencin mdica (debe informarlos a su equipo de atencin si persisten o si son molestos): Enrojecimiento Dolor de Public house manager en las articulaciones Dolor muscular Therapist, sports, enrojecimiento o Marketing executive de la inyeccin Puede ser que esta lista no menciona todos los posibles efectos secundarios. Comunquese a su mdico por asesoramiento mdico [employer]. Usted puede  informar los efectos secundarios a Technical brewer  por telfono al 1-800-FDA-1088. Dnde debo guardar mi medicina? Este medicamento se administra en hospitales o clnicas. No se guarda en su casa. ATENCIN: Este folleto es un resumen. Puede ser que no cubra toda la posible informacin. Si usted tiene preguntas acerca de esta medicina, consulte con su mdico, su farmacutico o su profesional de Radiographer, therapeutic.  2024 Elsevier/Gold Standard (2023-08-14 00:00:00)

## 2024-02-13 ENCOUNTER — Inpatient Hospital Stay

## 2024-02-20 ENCOUNTER — Inpatient Hospital Stay

## 2024-02-22 ENCOUNTER — Inpatient Hospital Stay

## 2024-02-22 VITALS — BP 109/67 | HR 71 | Temp 98.7°F | Resp 16

## 2024-02-22 DIAGNOSIS — D649 Anemia, unspecified: Secondary | ICD-10-CM

## 2024-02-22 DIAGNOSIS — D509 Iron deficiency anemia, unspecified: Secondary | ICD-10-CM | POA: Diagnosis not present

## 2024-02-22 MED ORDER — IRON SUCROSE 20 MG/ML IV SOLN
200.0000 mg | Freq: Once | INTRAVENOUS | Status: AC
  Administered 2024-02-22: 200 mg via INTRAVENOUS

## 2024-02-22 NOTE — Patient Instructions (Signed)

## 2024-02-29 ENCOUNTER — Inpatient Hospital Stay: Attending: Internal Medicine

## 2024-02-29 VITALS — BP 117/81 | HR 72 | Temp 96.0°F | Resp 19

## 2024-02-29 DIAGNOSIS — D649 Anemia, unspecified: Secondary | ICD-10-CM | POA: Insufficient documentation

## 2024-02-29 MED ORDER — IRON SUCROSE 20 MG/ML IV SOLN
200.0000 mg | Freq: Once | INTRAVENOUS | Status: AC
Start: 2024-02-29 — End: 2024-02-29
  Administered 2024-02-29: 200 mg via INTRAVENOUS

## 2024-02-29 MED ORDER — SODIUM CHLORIDE 0.9% FLUSH
10.0000 mL | Freq: Once | INTRAVENOUS | Status: AC | PRN
  Administered 2024-02-29: 10 mL
  Filled 2024-02-29: qty 10

## 2024-03-29 ENCOUNTER — Emergency Department
Admission: EM | Admit: 2024-03-29 | Discharge: 2024-03-29 | Disposition: A | Discharge status: Home / Self Care | Attending: Emergency Medicine | Admitting: Emergency Medicine

## 2024-03-29 ENCOUNTER — Other Ambulatory Visit: Payer: Self-pay

## 2024-03-29 DIAGNOSIS — K641 Second degree hemorrhoids: Secondary | ICD-10-CM | POA: Diagnosis not present

## 2024-03-29 DIAGNOSIS — K625 Hemorrhage of anus and rectum: Secondary | ICD-10-CM

## 2024-03-29 LAB — COMPREHENSIVE METABOLIC PANEL WITH GFR
ALT: 22 U/L (ref 0–44)
AST: 24 U/L (ref 15–41)
Albumin: 4.6 g/dL (ref 3.5–5.0)
Alkaline Phosphatase: 63 U/L (ref 38–126)
Anion gap: 11 (ref 5–15)
BUN: 9 mg/dL (ref 6–20)
CO2: 22 mmol/L (ref 22–32)
Calcium: 9.1 mg/dL (ref 8.9–10.3)
Chloride: 106 mmol/L (ref 98–111)
Creatinine, Ser: 0.68 mg/dL (ref 0.44–1.00)
GFR, Estimated: >60 mL/min (ref >60)
Glucose, Bld: 100 mg/dL — ABNORMAL HIGH (ref 70–99)
Potassium: 3.7 mmol/L (ref 3.5–5.1)
Sodium: 139 mmol/L (ref 135–145)
Total Bilirubin: 0.5 mg/dL (ref 0.0–1.2)
Total Protein: 7.9 g/dL (ref 6.5–8.1)

## 2024-03-29 LAB — CBC
HCT: 30.4 % — ABNORMAL LOW (ref 36.0–46.0)
Hemoglobin: 10.1 g/dL — ABNORMAL LOW (ref 12.0–15.0)
MCH: 29.5 pg (ref 26.0–34.0)
MCHC: 33.2 g/dL (ref 30.0–36.0)
MCV: 88.9 fL (ref 80.0–100.0)
Platelets: 358 10*3/uL (ref 150–400)
RBC: 3.42 MIL/uL — ABNORMAL LOW (ref 3.87–5.11)
RDW: 17.6 % — ABNORMAL HIGH (ref 11.5–15.5)
WBC: 9.5 10*3/uL (ref 4.0–10.5)
nRBC: 0 % (ref 0.0–0.2)

## 2024-03-29 LAB — TYPE AND SCREEN
ABO/RH(D): B POS
Antibody Screen: NEGATIVE

## 2024-03-29 LAB — LIPASE, BLOOD: Lipase: 35 U/L (ref 11–51)

## 2024-03-29 MED ORDER — HYDROCORTISONE ACETATE 25 MG RE SUPP: 25.0000 mg | Freq: Two times a day (BID) | RECTAL | 1 refills | Status: AC

## 2024-03-29 MED ORDER — HYDROCORTISONE ACETATE 25 MG RE SUPP
25.0000 mg | Freq: Once | RECTAL | Status: AC
  Administered 2024-03-29: 25 mg via RECTAL
  Filled 2024-03-29: qty 1

## 2024-03-29 NOTE — ED Triage Notes (Signed)
 First Nurse Note: Patient to ED from [facility] for rectal bleeding. Hx of hemorrhoids and anemia.

## 2024-03-29 NOTE — ED Triage Notes (Signed)
 Pt to ED via POV from [facility] Pt reports has been having rectal bleeding since Sunday and having small clots as well. Pt not on blood thinners. Pt reports lower abd and lower back pain. Pt with hx of hemorrhoids and anemia w/ blood transfusions.

## 2024-03-29 NOTE — ED Provider Notes (Signed)
 [facility] Emergency Department Provider Note     Event Date/Time   First MD Initiated Contact with Patient 03/29/24 1728     (approximate)   History   Rectal Bleeding   HPI  History limited by Spanish language.  Interpreter Blenda Burdock V.) present for interview and exam.  Melanie Dunlap is a 48 y.o. female with a history of anemia, depression, and hemorrhoids, presents to the ED for evaluation of rectal bleeding.  Patient presents from [facility], reporting rectal bleeding since 2024/04/18.  She has passed a few small clots as well.  She denies any loose stools or blood thinner use.  Patient would also endorse some lower abdominal pain as well as some lower back pain.  Her history includes anemia requiring RBC transfusion in the past.  She denies any fevers, chills, or sweats.  Physical Exam   Triage Vital Signs: ED Triage Vitals [03/29/24 1556]  Encounter Vitals Group     BP (!) 133/96     Systolic BP Percentile      Diastolic BP Percentile      Pulse Rate 92     Resp 20     Temp 98.7 F (37.1 C)     Temp Source Oral     SpO2 97 %     Weight      Height      Head Circumference      Peak Flow      Pain Score 7     Pain Loc      Pain Education      Exclude from Growth Chart     Most recent vital signs: Vitals:   03/29/24 1556  BP: (!) 133/96  Pulse: 92  Resp: 20  Temp: 98.7 F (37.1 C)  SpO2: 97%    General Awake, no distress. NAD HEENT NCAT. PERRL. EOMI. No rhinorrhea. Mucous membranes are moist.  CV:  Good peripheral perfusion. RRR RESP:  Normal effort. CTA ABD:  No distention.  Normal DRE.  Flat external hemorrhoid noted.  Single internal hemorrhoid prolapsed externally appreciated.  No active bleeding noted on exam.  Normal rectal tone.   ED Results / Procedures / Treatments   Labs (all labs ordered are listed, but only abnormal results are displayed) Labs Reviewed  COMPREHENSIVE METABOLIC PANEL WITH GFR - Abnormal;  Notable for the following components:      Result Value   Glucose, Bld 100 (*)    All other components within normal limits  CBC - Abnormal; Notable for the following components:   RBC 3.42 (*)    Hemoglobin 10.1 (*)    HCT 30.4 (*)    RDW 17.6 (*)    All other components within normal limits  LIPASE, BLOOD  URINALYSIS, ROUTINE W REFLEX MICROSCOPIC  POC URINE PREG, ED  TYPE AND SCREEN    EKG   RADIOLOGY   No results found.   PROCEDURES:  Critical Care performed: No  Procedures   MEDICATIONS ORDERED IN ED: Medications  hydrocortisone  (ANUSOL -HC) suppository 25 mg (25 mg Rectal Given by Other 03/29/24 1823)     IMPRESSION / MDM / ASSESSMENT AND PLAN / ED COURSE  I reviewed the triage vital signs and the nursing notes.                              Differential diagnosis includes, but is not limited to, hemorrhoids, anal fissure, constipation, rectal  abscess  Patient's presentation is most consistent with acute complicated illness / injury requiring diagnostic workup.  Patient's diagnosis is consistent with rectal bleeding secondary to internal hemorrhoids.  Patient with history of the same, presents to the ED in no acute distress.  Vital signs are stable without evidence of critical anemia or leukocytosis.  Exam confirms single prolapsed internal hemorrhoid.  Hydrocortisone  suppository was placed without difficulty.  Patient will be discharged home with prescriptions for Anusol  suppositories.  She is encouraged to increase her fiber intake to promote soft stools.  Patient is to follow up with her gastroenterologist as scheduled, as needed or otherwise directed. Patient is given ED precautions to return to the ED for any worsening or new symptoms.   FINAL CLINICAL IMPRESSION(S) / ED DIAGNOSES   Final diagnoses:  Rectal bleeding  Grade II hemorrhoids     Rx / DC Orders   ED Discharge Orders          Ordered    hydrocortisone  (ANUSOL -HC) 25 MG suppository   Every 12 hours        03/29/24 1821             Note:  This document was prepared using Dragon voice recognition software and may include unintentional dictation errors.    May Sparks, PA-C 03/29/24 1830    Bradler, Evan K, MD 03/30/24 1539

## 2024-03-29 NOTE — Discharge Instructions (Addendum)
 Your exam is normal and reassuring at this time.  No signs of any critical anemia.  Your exam shows an external hemorrhoid visible from the outside of the rectum.  Use the rectal suppository every 12 hours as needed while hemorrhoids are inflamed.  You should increase your fiber intake daily, to promote soft stools.  Follow-up with your primary provider or your gastroenterology specialist for further evaluation and management of your hemorrhoids.  Su examen es normal y Administrator. No hay signos de anemia crtica. Su examen muestra una hemorroide externa visible desde el exterior del recto. Use el supositorio rectal cada 12 horas segn sea necesario mientras las hemorroides estn inflamadas. Debe aumentar su consumo de fibra diariamente para promover heces blandas. Consulte con su mdico de cabecera o gastroenterlogo para una evaluacin y tratamiento adicionales de sus hemorroides.

## 2024-03-29 NOTE — ED Notes (Signed)
 See triage note  Presents with some rectal bleeding  HX of same  States this happens at least monthly  Now she feels it is worse

## 2024-04-30 ENCOUNTER — Inpatient Hospital Stay (HOSPITAL_BASED_OUTPATIENT_CLINIC_OR_DEPARTMENT_OTHER): Admitting: Internal Medicine

## 2024-04-30 ENCOUNTER — Inpatient Hospital Stay

## 2024-04-30 ENCOUNTER — Encounter: Payer: Self-pay | Admitting: Internal Medicine

## 2024-04-30 ENCOUNTER — Inpatient Hospital Stay: Attending: Internal Medicine

## 2024-04-30 VITALS — BP 116/75 | HR 72 | Resp 18

## 2024-04-30 VITALS — BP 122/85 | HR 72 | Temp 96.9°F | Resp 16 | Ht 64.0 in | Wt 186.0 lb

## 2024-04-30 DIAGNOSIS — Z8616 Personal history of COVID-19: Secondary | ICD-10-CM | POA: Insufficient documentation

## 2024-04-30 DIAGNOSIS — D649 Anemia, unspecified: Secondary | ICD-10-CM

## 2024-04-30 DIAGNOSIS — Z7901 Long term (current) use of anticoagulants: Secondary | ICD-10-CM | POA: Insufficient documentation

## 2024-04-30 DIAGNOSIS — R531 Weakness: Secondary | ICD-10-CM | POA: Diagnosis not present

## 2024-04-30 DIAGNOSIS — N920 Excessive and frequent menstruation with regular cycle: Secondary | ICD-10-CM | POA: Insufficient documentation

## 2024-04-30 DIAGNOSIS — R5383 Other fatigue: Secondary | ICD-10-CM | POA: Insufficient documentation

## 2024-04-30 DIAGNOSIS — D509 Iron deficiency anemia, unspecified: Secondary | ICD-10-CM | POA: Diagnosis not present

## 2024-04-30 DIAGNOSIS — K625 Hemorrhage of anus and rectum: Secondary | ICD-10-CM | POA: Insufficient documentation

## 2024-04-30 DIAGNOSIS — Z86718 Personal history of other venous thrombosis and embolism: Secondary | ICD-10-CM | POA: Diagnosis not present

## 2024-04-30 LAB — IRON AND TIBC
Iron: 28 ug/dL (ref 28–170)
Saturation Ratios: 6 % — ABNORMAL LOW (ref 10.4–31.8)
TIBC: 455 ug/dL — ABNORMAL HIGH (ref 250–450)
UIBC: 427 ug/dL

## 2024-04-30 LAB — CBC WITH DIFFERENTIAL (CANCER CENTER ONLY)
Abs Immature Granulocytes: 0.03 10*3/uL (ref 0.00–0.07)
Basophils Absolute: 0 10*3/uL (ref 0.0–0.1)
Basophils Relative: 0 %
Eosinophils Absolute: 0.1 10*3/uL (ref 0.0–0.5)
Eosinophils Relative: 1 %
HCT: 32.5 % — ABNORMAL LOW (ref 36.0–46.0)
Hemoglobin: 10.4 g/dL — ABNORMAL LOW (ref 12.0–15.0)
Immature Granulocytes: 0 %
Lymphocytes Relative: 36 %
Lymphs Abs: 3.3 10*3/uL (ref 0.7–4.0)
MCH: 29 pg (ref 26.0–34.0)
MCHC: 32 g/dL (ref 30.0–36.0)
MCV: 90.5 fL (ref 80.0–100.0)
Monocytes Absolute: 0.5 10*3/uL (ref 0.1–1.0)
Monocytes Relative: 6 %
Neutro Abs: 5.1 10*3/uL (ref 1.7–7.7)
Neutrophils Relative %: 57 %
Platelet Count: 446 10*3/uL — ABNORMAL HIGH (ref 150–400)
RBC: 3.59 MIL/uL — ABNORMAL LOW (ref 3.87–5.11)
RDW: 13.5 % (ref 11.5–15.5)
WBC Count: 9.1 10*3/uL (ref 4.0–10.5)
nRBC: 0 % (ref 0.0–0.2)

## 2024-04-30 LAB — BASIC METABOLIC PANEL WITH GFR
Anion gap: 6 (ref 5–15)
BUN: 14 mg/dL (ref 6–20)
CO2: 23 mmol/L (ref 22–32)
Calcium: 9.1 mg/dL (ref 8.9–10.3)
Chloride: 104 mmol/L (ref 98–111)
Creatinine, Ser: 0.57 mg/dL (ref 0.44–1.00)
GFR, Estimated: >60 mL/min (ref >60)
Glucose, Bld: 93 mg/dL (ref 70–99)
Potassium: 3.6 mmol/L (ref 3.5–5.1)
Sodium: 133 mmol/L — ABNORMAL LOW (ref 135–145)

## 2024-04-30 LAB — PREGNANCY, URINE: Preg Test, Ur: NEGATIVE

## 2024-04-30 LAB — FERRITIN: Ferritin: 6 ng/mL — ABNORMAL LOW (ref 11–307)

## 2024-04-30 LAB — APTT: aPTT: 35 s (ref 24–36)

## 2024-04-30 LAB — PROTIME-INR
INR: 1 (ref 0.8–1.2)
Prothrombin Time: 13.7 s (ref 11.4–15.2)

## 2024-04-30 LAB — LACTATE DEHYDROGENASE: LDH: 101 U/L (ref 98–192)

## 2024-04-30 MED ORDER — IRON SUCROSE 20 MG/ML IV SOLN
200.0000 mg | Freq: Once | INTRAVENOUS | Status: AC
  Administered 2024-04-30: 200 mg via INTRAVENOUS

## 2024-04-30 NOTE — Assessment & Plan Note (Addendum)
#   Anemia- Hb-symptomatic [hb 8.1; ferritin- 8-? FEB 2025- Dr.George].  Likely due to iron  deficiency - from hemorrhoidal bleeding/menorrhagia.   Patient has Poor tolerance- contipation/lack of improvement on oral iron .   # S/p venofer -hemoglobin improved; proceed with Venofer .   # Hx of ? APS- [duing pregnacy- OB [facility]- No hematology]- s/p Lovenox- NO hx of DVT- not on anticoagulation- labs pending today.  #Etiology of iron  deficiency: Likely secondary menorrhagia/hemorroidal bleeding [[facility]] EGD colonoscopy March 2025 negative.  Rectal bleeding likely hemorrhoidal-recommend follow up with GI/consider banding.   # DISPOSITION: # venofer  today # monthly Venofer  x 2  # follow up 4 months  MD; labs- cbc/bmp; urine preg test- iron  studies; ferritin-LDH-possible venofer - Dr.B

## 2024-04-30 NOTE — Progress Notes (Signed)
 Patient here for follow up; patient denies any concerns at this time

## 2024-04-30 NOTE — Patient Instructions (Signed)
 Iron Sucrose Injection Qu es este medicamento? El HIERRO SACAROSA trata los niveles bajos de hierro (anemia por deficiencia de Company secretary) en personas con enfermedad renal. El hierro es un mineral que cumple una funcin importante en la produccin de glbulos rojos, que llevan el oxgeno de los pulmones al resto del cuerpo. Este medicamento puede ser utilizado para otros usos; si tiene alguna pregunta consulte con su proveedor de atencin mdica o con su farmacutico. MARCAS COMUNES: Venofer Qu le debo informar a mi profesional de la salud antes de tomar este medicamento? Necesitan saber si usted presenta alguno de los [employer] o situaciones: Anemia no causada por niveles bajos de hierro Careers information officer altos de hierro en la sangre Enfermedad renal Enfermedad heptica Una reaccin alrgica o inusual al hierro, a otros medicamentos, alimentos, colorantes o conservantes Si est embarazada o buscando quedar embarazada Si est amamantando a un beb Cmo debo Visual merchandiser medicamento? Este medicamento es para infusin en una vena. Su equipo de atencin lo [employer] en un hospital o en un entorno clnico. Hable con su equipo de atencin sobre el uso de este medicamento en nios. Aunque se puede recetar a nios tan pequeos como de 2 aos de edad con ciertas afecciones, existen precauciones que deben tomarse. Sobredosis: Pngase en contacto inmediatamente con un centro toxicolgico o una sala de urgencia si usted cree que haya tomado demasiado medicamento.<br>ATENCIN: [employer] es solo para usted. No comparta este medicamento con nadie. Qu sucede si me olvido de una dosis? Cumpla con las citas para dosis de seguimiento. Es importante no olvidar ninguna dosis. Llame a su equipo de atencin si no puede asistir a una cita. Qu puede interactuar con este medicamento? No use este medicamento con ninguno de los siguientes productos: Deferoxamina Dimercaprol Otros  productos con hierro Industrial/product designer tambin podra Product/process development scientist con los siguientes productos: Cloranfenicol Deferasirox Puede ser que esta lista no menciona todas las posibles interacciones. Informe a su profesional de [employer] de [employer] productos a base de hierbas, medicamentos de [place] o suplementos nutritivos que est tomando. Si usted fuma, consume bebidas alcohlicas o si utiliza drogas ilegales, indqueselo tambin a su profesional de [employer]. Algunas sustancias pueden interactuar con su medicamento. A qu debo estar atento al usar [employer]? Visite a su equipo de atencin para que revise su evolucin peridicamente. Informe a su equipo de atencin si los sntomas no comienzan a mejorar o si empeoran. Usted podra necesitar realizarse [employer] de sangre mientras est usando [place]. Es posible que deba comer ms alimentos que contienen hierro. Hable con su equipo de atencin. Los alimentos que contienen hierro incluyen granos o cereales enteros, frutas secas, legumbres, guisantes, verduras de hojas verdes y rganos internos (hgado, rin). Qu efectos secundarios puedo tener al [employer] este medicamento? Efectos secundarios que debe informar a su equipo de atencin tan pronto como sea posible: Reacciones alrgicas: erupcin cutnea, comezn/picazn, urticaria, hinchazn de la cara, los labios, la lengua o la garganta Presin arterial baja: mareo, sensacin de desmayo o aturdimiento, visin borrosa Falta de aliento Efectos secundarios que generalmente no requieren atencin mdica (debe informarlos a su equipo de atencin si persisten o si son molestos): Enrojecimiento Dolor de Public house manager en las articulaciones Dolor muscular Therapist, sports, enrojecimiento o Marketing executive de la inyeccin Puede ser que esta lista no menciona todos los posibles efectos secundarios. Comunquese a su mdico por asesoramiento mdico [employer]. Usted puede  informar los efectos secundarios a Technical brewer  por telfono al 1-800-FDA-1088. Dnde debo guardar mi medicina? Este medicamento se administra en hospitales o clnicas. No se guarda en su casa. ATENCIN: Este folleto es un resumen. Puede ser que no cubra toda la posible informacin. Si usted tiene preguntas acerca de esta medicina, consulte con su mdico, su farmacutico o su profesional de Radiographer, therapeutic.  2024 Elsevier/Gold Standard (2023-08-14 00:00:00)

## 2024-04-30 NOTE — Progress Notes (Signed)
 [facility] CONSULT NOTE  Patient Care Team: [place], [facility] as PCP - General Rennie Cindy SAUNDERS, MD as Consulting Physician (Oncology)  CHIEF COMPLAINTS/PURPOSE OF CONSULTATION: ANEMIA   HEMATOLOGY HISTORY  # ANEMIA[Hb; MCV-platelets- WBC; Iron  sat; ferritin;  GFR- CT/US - ;   HISTORY OF PRESENTING ILLNESS: Patient ambulating-independently. /Accompanied by interpreter.   Melanie Dunlap 48 y.o.  female pleasant patient with iron  deficiency anemia-secondary to hemorrhoidal/menstrual bleeding here for follow-up.  In the interim patient was evaluated in the emergency room from hemorrhoidal bleeding.  Patient status post iron  infusions noted improvement of her shortness of breath with exertion, and excessive fatigue.     Review of Systems  Constitutional:  Positive for malaise/fatigue. Negative for chills, diaphoresis and fever.  HENT:  Negative for nosebleeds and sore throat.   Eyes:  Negative for double vision.  Respiratory:  Negative for cough, hemoptysis, sputum production, shortness of breath and wheezing.   Cardiovascular:  Negative for chest pain, palpitations, orthopnea and leg swelling.  Gastrointestinal:  Negative for abdominal pain, blood in stool, constipation, diarrhea, heartburn, melena, nausea and vomiting.  Genitourinary:  Negative for dysuria, frequency and urgency.  Musculoskeletal:  Negative for back pain and joint pain.  Skin: Negative.  Negative for itching and rash.  Neurological:  Positive for dizziness. Negative for tingling, focal weakness, weakness and headaches.  Endo/Heme/Allergies:  Does not bruise/bleed easily.  Psychiatric/Behavioral:  Negative for depression. The patient is not nervous/anxious and does not have insomnia.      MEDICAL HISTORY:  Past Medical History:  Diagnosis Date   Anemia    Depression    Lab test positive for detection of COVID-19 virus 12/2019    SURGICAL HISTORY: Past Surgical History:   Procedure Laterality Date   BREAST BIOPSY Left 2016   core bx   COLONOSCOPY N/A 01/26/2024   Procedure: COLONOSCOPY;  Surgeon: Maryruth Ole DASEN, MD;  Location: [facility] ENDOSCOPY;  Service: Endoscopy;  Laterality: N/A;  SPANISH INTERPRETER   ESOPHAGOGASTRODUODENOSCOPY N/A 01/26/2024   Procedure: EGD (ESOPHAGOGASTRODUODENOSCOPY);  Surgeon: Maryruth Ole DASEN, MD;  Location: [facility] ENDOSCOPY;  Service: Endoscopy;  Laterality: N/A;   POLYPECTOMY  01/26/2024   Procedure: POLYPECTOMY;  Surgeon: Maryruth Ole DASEN, MD;  Location: [facility] ENDOSCOPY;  Service: Endoscopy;;    SOCIAL HISTORY: Social History   Socioeconomic History   Marital status: Married    Spouse name: Not on file   Number of children: Not on file   Years of education: Not on file   Highest education level: Not on file  Occupational History   Not on file  Tobacco Use   Smoking status: Never   Smokeless tobacco: Never  Vaping Use   Vaping status: Never Used  Substance and Sexual Activity   Alcohol use: Never   Drug use: Never   Sexual activity: Yes  Other Topics Concern   Not on file  Social History Narrative   Not on file   Social Drivers of Health   Financial Resource Strain: Not on file  Food Insecurity: No Food Insecurity (01/30/2024)   Hunger Vital Sign    Worried About Running Out of Food in the Last Year: Never true    Ran Out of Food in the Last Year: Never true  Transportation Needs: No Transportation Needs (01/30/2024)   PRAPARE - Administrator, Civil Service (Medical): No    Lack of Transportation (Non-Medical): No  Physical Activity: Not on file  Stress: Not on file  Social Connections: Not on file  Intimate Partner Violence: Not At Risk (01/30/2024)   Humiliation, Afraid, Rape, and Kick questionnaire    Fear of Current or Ex-Partner: No    Emotionally Abused: No    Physically Abused: No    Sexually Abused: No    FAMILY HISTORY: Family History  Problem Relation Age of Onset   Breast  cancer Neg Hx     ALLERGIES:  has no known allergies.  MEDICATIONS:  Current Outpatient Medications  Medication Sig Dispense Refill   citalopram (CELEXA) 20 MG tablet Take 20 mg by mouth daily.     hydrocortisone  (ANUSOL -HC) 2.5 % rectal cream Apply topically 3 (three) times daily.     No current facility-administered medications for this visit.     PHYSICAL EXAMINATION:   Vitals:   04/30/24 1431  BP: 122/85  Pulse: 72  Resp: 16  Temp: (!) 96.9 F (36.1 C)  SpO2: 99%   Filed Weights   04/30/24 1431  Weight: 186 lb (84.4 kg)    Physical Exam Vitals and nursing note reviewed.  HENT:     Head: Normocephalic and atraumatic.     Mouth/Throat:     Pharynx: Oropharynx is clear.   Eyes:     Extraocular Movements: Extraocular movements intact.     Pupils: Pupils are equal, round, and reactive to light.    Cardiovascular:     Rate and Rhythm: Normal rate and regular rhythm.  Pulmonary:     Comments: Decreased breath sounds bilaterally.  Abdominal:     Palpations: Abdomen is soft.   Musculoskeletal:        General: Normal range of motion.     Cervical back: Normal range of motion.   Skin:    General: Skin is warm.   Neurological:     General: No focal deficit present.     Mental Status: She is alert and oriented to person, place, and time.   Psychiatric:        Behavior: Behavior normal.        Judgment: Judgment normal.      LABORATORY DATA:  I have reviewed the data as listed Lab Results  Component Value Date   WBC 9.1 04/30/2024   HGB 10.4 (L) 04/30/2024   HCT 32.5 (L) 04/30/2024   MCV 90.5 04/30/2024   PLT 446 (H) 04/30/2024   Recent Labs    01/30/24 1404 03/29/24 1558 04/30/24 1415  NA 134* 139 133*  K 3.8 3.7 3.6  CL 106 106 104  CO2 22 22 23   GLUCOSE 89 100* 93  BUN 10 9 14   CREATININE 0.62 0.68 0.57  CALCIUM 8.9 9.1 9.1  GFRNONAA >60 >60 >60  PROT 7.6 7.9  --   ALBUMIN 4.1 4.6  --   AST 16 24  --   ALT 16 22  --   ALKPHOS 74  63  --   BILITOT 0.7 0.5  --      No results found.  ASSESSMENT & PLAN:   Symptomatic anemia # Anemia- Hb-symptomatic [hb 8.1; ferritin- 8-? FEB 2025- Dr.George].  Likely due to iron  deficiency - from hemorrhoidal bleeding/menorrhagia.   Patient has Poor tolerance- contipation/lack of improvement on oral iron .   # S/p venofer -hemoglobin improved; proceed with Venofer .   # Hx of ? APS- [duing pregnacy- OB [facility]- No hematology]- s/p Lovenox- NO hx of DVT- not on anticoagulation- labs pending today.  #Etiology of iron  deficiency: Likely secondary menorrhagia/hemorroidal bleeding [[facility]] EGD colonoscopy  March 2025 negative.  Rectal bleeding likely hemorrhoidal-recommend follow up with GI/consider banding.   # DISPOSITION: # venofer  today # monthly Venofer  x 2  # follow up 4 months  MD; labs- cbc/bmp; urine preg test- iron  studies; ferritin-LDH-possible venofer - Dr.B  All questions were answered. The patient knows to call the clinic with any problems, questions or concerns.    Cindy JONELLE Joe, MD 04/30/2024 3:12 PM

## 2024-05-01 LAB — BETA-2-GLYCOPROTEIN I ABS, IGG/M/A
Beta-2 Glyco I IgG: <9 GPI IgG units (ref 0–20)
Beta-2-Glycoprotein I IgA: 46 GPI IgA units — ABNORMAL HIGH (ref 0–25)
Beta-2-Glycoprotein I IgM: <9 GPI IgM units (ref 0–32)

## 2024-05-01 LAB — ANTIPHOSPHOLIPID SYNDROME PROF
Anticardiolipin IgG: <9 GPL U/mL (ref 0–14)
Anticardiolipin IgM: 18 [MPL'U]/mL — ABNORMAL HIGH (ref 0–12)
DRVVT: 26.3 s (ref 0.0–47.0)
PTT Lupus Anticoagulant: 30.7 s (ref 0.0–43.5)

## 2024-05-27 ENCOUNTER — Ambulatory Visit
Admission: RE | Admit: 2024-05-27 | Discharge: 2024-05-27 | Disposition: A | Discharge status: Home / Self Care | Payer: BC Managed Care – PPO | Source: Ambulatory Visit | Attending: Family Medicine | Admitting: Family Medicine

## 2024-05-27 DIAGNOSIS — Z1231 Encounter for screening mammogram for malignant neoplasm of breast: Secondary | ICD-10-CM | POA: Insufficient documentation

## 2024-05-29 ENCOUNTER — Telehealth: Payer: Self-pay

## 2024-05-29 NOTE — Telephone Encounter (Signed)
 Called the pt with an interpreter to get pt scheduled for an appointment from an referral rec'd from [facility] in [place].  Will try back at a later time.

## 2024-06-03 ENCOUNTER — Inpatient Hospital Stay: Attending: Internal Medicine

## 2024-06-03 DIAGNOSIS — D509 Iron deficiency anemia, unspecified: Secondary | ICD-10-CM | POA: Insufficient documentation

## 2024-06-10 ENCOUNTER — Inpatient Hospital Stay

## 2024-06-10 VITALS — BP 112/58 | HR 70 | Temp 97.2°F | Resp 18

## 2024-06-10 DIAGNOSIS — D509 Iron deficiency anemia, unspecified: Secondary | ICD-10-CM | POA: Diagnosis present

## 2024-06-10 DIAGNOSIS — D649 Anemia, unspecified: Secondary | ICD-10-CM

## 2024-06-10 MED ORDER — IRON SUCROSE 20 MG/ML IV SOLN
200.0000 mg | Freq: Once | INTRAVENOUS | Status: AC
  Administered 2024-06-10 (×2): 200 mg via INTRAVENOUS
  Filled 2024-06-10: qty 10

## 2024-07-04 ENCOUNTER — Inpatient Hospital Stay: Attending: Internal Medicine

## 2024-07-04 VITALS — BP 131/92 | HR 81 | Temp 97.0°F | Resp 18

## 2024-07-04 DIAGNOSIS — Z79899 Other long term (current) drug therapy: Secondary | ICD-10-CM | POA: Diagnosis not present

## 2024-07-04 DIAGNOSIS — D509 Iron deficiency anemia, unspecified: Secondary | ICD-10-CM | POA: Insufficient documentation

## 2024-07-04 DIAGNOSIS — D649 Anemia, unspecified: Secondary | ICD-10-CM

## 2024-07-04 MED ORDER — IRON SUCROSE 20 MG/ML IV SOLN
200.0000 mg | Freq: Once | INTRAVENOUS | Status: AC
  Administered 2024-07-04: 200 mg via INTRAVENOUS
  Filled 2024-07-04: qty 10

## 2024-07-04 MED ORDER — SODIUM CHLORIDE 0.9% FLUSH
10.0000 mL | Freq: Once | INTRAVENOUS | Status: AC | PRN
  Administered 2024-07-04: 10 mL
  Filled 2024-07-04: qty 10

## 2024-09-06 ENCOUNTER — Encounter: Payer: Self-pay | Admitting: Internal Medicine

## 2024-09-06 ENCOUNTER — Inpatient Hospital Stay (HOSPITAL_BASED_OUTPATIENT_CLINIC_OR_DEPARTMENT_OTHER): Admitting: Internal Medicine

## 2024-09-06 ENCOUNTER — Inpatient Hospital Stay: Attending: Internal Medicine

## 2024-09-06 ENCOUNTER — Inpatient Hospital Stay

## 2024-09-06 VITALS — BP 122/82 | HR 80

## 2024-09-06 VITALS — BP 123/81 | HR 87 | Temp 97.1°F | Ht 64.0 in | Wt 190.7 lb

## 2024-09-06 DIAGNOSIS — D649 Anemia, unspecified: Secondary | ICD-10-CM

## 2024-09-06 LAB — CBC WITH DIFFERENTIAL (CANCER CENTER ONLY)
Abs Immature Granulocytes: 0.05 K/uL (ref 0.00–0.07)
Basophils Absolute: 0 K/uL (ref 0.0–0.1)
Basophils Relative: 0 %
Eosinophils Absolute: 0.1 K/uL (ref 0.0–0.5)
Eosinophils Relative: 1 %
HCT: 36 % (ref 36.0–46.0)
Hemoglobin: 12 g/dL (ref 12.0–15.0)
Immature Granulocytes: 1 %
Lymphocytes Relative: 34 %
Lymphs Abs: 3.2 K/uL (ref 0.7–4.0)
MCH: 30.9 pg (ref 26.0–34.0)
MCHC: 33.3 g/dL (ref 30.0–36.0)
MCV: 92.8 fL (ref 80.0–100.0)
Monocytes Absolute: 0.6 K/uL (ref 0.1–1.0)
Monocytes Relative: 7 %
Neutro Abs: 5.4 K/uL (ref 1.7–7.7)
Neutrophils Relative %: 57 %
Platelet Count: 347 K/uL (ref 150–400)
RBC: 3.88 MIL/uL (ref 3.87–5.11)
RDW: 13.3 % (ref 11.5–15.5)
WBC Count: 9.4 K/uL (ref 4.0–10.5)
nRBC: 0 % (ref 0.0–0.2)

## 2024-09-06 LAB — IRON AND TIBC
Iron: 73 ug/dL (ref 28–170)
Saturation Ratios: 19 % (ref 10.4–31.8)
TIBC: 389 ug/dL (ref 250–450)
UIBC: 316 ug/dL

## 2024-09-06 LAB — LACTATE DEHYDROGENASE: LDH: 105 U/L (ref 98–192)

## 2024-09-06 LAB — BASIC METABOLIC PANEL - CANCER CENTER ONLY
Anion gap: 6 (ref 5–15)
BUN: 9 mg/dL (ref 6–20)
CO2: 25 mmol/L (ref 22–32)
Calcium: 9.1 mg/dL (ref 8.9–10.3)
Chloride: 104 mmol/L (ref 98–111)
Creatinine: 0.67 mg/dL (ref 0.44–1.00)
GFR, Estimated: >60 mL/min (ref >60)
Glucose, Bld: 114 mg/dL — ABNORMAL HIGH (ref 70–99)
Potassium: 3.6 mmol/L (ref 3.5–5.1)
Sodium: 135 mmol/L (ref 135–145)

## 2024-09-06 LAB — PREGNANCY, URINE: Preg Test, Ur: NEGATIVE

## 2024-09-06 LAB — FERRITIN: Ferritin: 25 ng/mL (ref 11–307)

## 2024-09-06 MED ORDER — IRON SUCROSE 20 MG/ML IV SOLN
200.0000 mg | Freq: Once | INTRAVENOUS | Status: AC
  Administered 2024-09-06: 200 mg via INTRAVENOUS

## 2024-09-06 NOTE — Progress Notes (Signed)
 [facility] CONSULT NOTE  Patient Care Team: [place], [facility] as PCP - General Rennie Cindy SAUNDERS, MD as Consulting Physician (Oncology)  CHIEF COMPLAINTS/PURPOSE OF CONSULTATION: ANEMIA   HEMATOLOGY HISTORY  # ANEMIA[Hb; MCV-platelets- WBC; Iron  sat; ferritin;  GFR- CT/US - ;   HISTORY OF PRESENTING ILLNESS: Patient ambulating-independently. /Accompanied by interpreter.   Discussed the use of AI scribe software for clinical note transcription with the patient, who gave verbal consent to proceed.  History of Present Illness Melanie Dunlap is a 48 year old female with iron  deficiency anemia secondary to history of menstrual cycles who presents for follow-up after iron  infusions.  She has completed seven iron  infusions without any adverse reactions. Her hemoglobin levels have increased from a range of 9-10 to 12.  She experiences minimal rectal bleeding, which is only visible on toilet paper and is less severe than previously.  Her menstrual cycles last four to five days.     Review of Systems  Constitutional:  Positive for malaise/fatigue. Negative for chills, diaphoresis and fever.  HENT:  Negative for nosebleeds and sore throat.   Eyes:  Negative for double vision.  Respiratory:  Negative for cough, hemoptysis, sputum production, shortness of breath and wheezing.   Cardiovascular:  Negative for chest pain, palpitations, orthopnea and leg swelling.  Gastrointestinal:  Negative for abdominal pain, blood in stool, constipation, diarrhea, heartburn, melena, nausea and vomiting.  Genitourinary:  Negative for dysuria, frequency and urgency.  Musculoskeletal:  Negative for back pain and joint pain.  Skin: Negative.  Negative for itching and rash.  Neurological:  Positive for dizziness. Negative for tingling, focal weakness, weakness and headaches.  Endo/Heme/Allergies:  Does not bruise/bleed easily.  Psychiatric/Behavioral:  Negative for  depression. The patient is not nervous/anxious and does not have insomnia.      MEDICAL HISTORY:  Past Medical History:  Diagnosis Date   Anemia    Depression    Lab test positive for detection of COVID-19 virus 12/2019    SURGICAL HISTORY: Past Surgical History:  Procedure Laterality Date   BREAST BIOPSY Left 2016   core bx   COLONOSCOPY N/A 01/26/2024   Procedure: COLONOSCOPY;  Surgeon: Maryruth Ole DASEN, MD;  Location: [facility] ENDOSCOPY;  Service: Endoscopy;  Laterality: N/A;  SPANISH INTERPRETER   ESOPHAGOGASTRODUODENOSCOPY N/A 01/26/2024   Procedure: EGD (ESOPHAGOGASTRODUODENOSCOPY);  Surgeon: Maryruth Ole DASEN, MD;  [facility] ENDOSCOPY;  Service: Endoscopy;  Laterality: N/A;   POLYPECTOMY  01/26/2024   Procedure: POLYPECTOMY;  Surgeon: Maryruth Ole DASEN, MD;  Location: [facility] ENDOSCOPY;  Service: Endoscopy;;    SOCIAL HISTORY: Social History   Socioeconomic History   Marital status: Married    Spouse name: Not on file   Number of children: Not on file   Years of education: Not on file   Highest education level: Not on file  Occupational History   Not on file  Tobacco Use   Smoking status: Never   Smokeless tobacco: Never  Vaping Use   Vaping status: Never Used  Substance and Sexual Activity   Alcohol use: Never   Drug use: Never   Sexual activity: Yes  Other Topics Concern   Not on file  Social History Narrative   Not on file   Social Drivers of Health   Financial Resource Strain: Not on file  Food Insecurity: No Food Insecurity (01/30/2024)   Hunger Vital Sign    Worried About Running Out of Food in the Last Year: Never true  Ran Out of Food in the Last Year: Never true  Transportation Needs: No Transportation Needs (01/30/2024)   PRAPARE - Administrator, Civil Service (Medical): No    Lack of Transportation (Non-Medical): No  Physical Activity: Not on file  Stress: Not on file  Social Connections: Not on file  Intimate Partner  Violence: Not At Risk (01/30/2024)   Humiliation, Afraid, Rape, and Kick questionnaire    Fear of Current or Ex-Partner: No    Emotionally Abused: No    Physically Abused: No    Sexually Abused: No    FAMILY HISTORY: Family History  Problem Relation Age of Onset   Breast cancer Neg Hx     ALLERGIES:  has no known allergies.  MEDICATIONS:  Current Outpatient Medications  Medication Sig Dispense Refill   citalopram (CELEXA) 20 MG tablet Take 20 mg by mouth daily.     hydrocortisone  (ANUSOL -HC) 2.5 % rectal cream Apply topically as needed.     No current facility-administered medications for this visit.     PHYSICAL EXAMINATION:   Vitals:   09/06/24 1446  BP: 123/81  Pulse: 87  Temp: (!) 97.1 F (36.2 C)  SpO2: 99%   Filed Weights   09/06/24 1446  Weight: 190 lb 11.2 oz (86.5 kg)    Physical Exam Vitals and nursing note reviewed.  HENT:     Head: Normocephalic and atraumatic.     Mouth/Throat:     Pharynx: Oropharynx is clear.  Eyes:     Extraocular Movements: Extraocular movements intact.     Pupils: Pupils are equal, round, and reactive to light.  Cardiovascular:     Rate and Rhythm: Normal rate and regular rhythm.  Pulmonary:     Comments: Decreased breath sounds bilaterally.  Abdominal:     Palpations: Abdomen is soft.  Musculoskeletal:        General: Normal range of motion.     Cervical back: Normal range of motion.  Skin:    General: Skin is warm.  Neurological:     General: No focal deficit present.     Mental Status: She is alert and oriented to person, place, and time.  Psychiatric:        Behavior: Behavior normal.        Judgment: Judgment normal.      LABORATORY DATA:  I have reviewed the data as listed Lab Results  Component Value Date   WBC 9.4 09/06/2024   HGB 12.0 09/06/2024   HCT 36.0 09/06/2024   MCV 92.8 09/06/2024   PLT 347 09/06/2024   Recent Labs    01/30/24 1404 03/29/24 1558 04/30/24 1415 09/06/24 1455  NA 134*  139 133* 135  K 3.8 3.7 3.6 3.6  CL 106 106 104 104  CO2 22 22 23 25   GLUCOSE 89 100* 93 114*  BUN 10 9 14 9   CREATININE 0.62 0.68 0.57 0.67  CALCIUM 8.9 9.1 9.1 9.1  GFRNONAA >60 >60 >60 >60  PROT 7.6 7.9  --   --   ALBUMIN 4.1 4.6  --   --   AST 16 24  --   --   ALT 16 22  --   --   ALKPHOS 74 63  --   --   BILITOT 0.7 0.5  --   --      No results found.  ASSESSMENT & PLAN:   Symptomatic anemia # Anemia- Hb-symptomatic [hb 8.1; ferritin- 8-? FEB 2025- Dr.George].  Likely due  to iron  deficiency - from hemorrhoidal bleeding/menorrhagia.   Patient has Poor tolerance- contipation/lack of improvement on oral iron .   # S/p venofer -hemoglobin improved; proceed with Venofer .   #Etiology of iron  deficiency: Likely secondary menorrhagia/hemorroidal bleeding [[facility]] EGD colonoscopy March 2025 negative.  Rectal bleeding likely hemorrhoidal-recommend follow up with GI/consider banding.  Defer to GI.  # Hx of ? APS- [duing pregnacy- OB [facility]- No hematology]- s/p Lovenox- NO hx of DVT- not on anticoagulation-July 2025-negative lupus anticoagulant; slightly positive antiphospholipid IgM; beta-2  glycoprotein-clinically this is not consistent with antiphospholipid antibody syndrome.  # DISPOSITION: # venofer  today # follow up 6 months  MD; labs- cbc/bmp; urine preg test- iron  studies; ferritin-LDH-possible venofer - Dr.B   All questions were answered. The patient knows to call the clinic with any problems, questions or concerns.    Cindy JONELLE Joe, MD 09/08/2024 3:45 PM

## 2024-09-06 NOTE — Patient Instructions (Signed)

## 2024-09-06 NOTE — Progress Notes (Signed)
 Patient states no concerns or complaints for today.

## 2024-09-06 NOTE — Progress Notes (Signed)
 Patient tolerated iron  infusion with no complaints voiced.  Peripheral IV site clean and dry with good blood return noted before and after infusion.  Pt declined the 30 minute post observation.  VSS with discharge and left in satisfactory condition with no s/s of distress noted. All follow ups as scheduled.   Melanie Dunlap

## 2024-09-06 NOTE — Assessment & Plan Note (Addendum)
#   Anemia- Hb-symptomatic [hb 8.1; ferritin- 8-? FEB 2025- Dr.George].  Likely due to iron  deficiency - from hemorrhoidal bleeding/menorrhagia.   Patient has Poor tolerance- contipation/lack of improvement on oral iron .   # S/p venofer -hemoglobin improved; proceed with Venofer .   #Etiology of iron  deficiency: Likely secondary menorrhagia/hemorroidal bleeding [[facility]] EGD colonoscopy March 2025 negative.  Rectal bleeding likely hemorrhoidal-recommend follow up with GI/consider banding.  Defer to GI.  # Hx of ? APS- [duing pregnacy- OB [facility]- No hematology]- s/p Lovenox- NO hx of DVT- not on anticoagulation-July 2025-negative lupus anticoagulant; slightly positive antiphospholipid IgM; beta-2  glycoprotein-clinically this is not consistent with antiphospholipid antibody syndrome.  # DISPOSITION: # venofer  today # follow up 6 months  MD; labs- cbc/bmp; urine preg test- iron  studies; ferritin-LDH-possible venofer - Dr.B

## 2024-09-08 ENCOUNTER — Encounter: Payer: Self-pay | Admitting: Internal Medicine

## 2025-03-10 ENCOUNTER — Inpatient Hospital Stay: Admitting: Internal Medicine

## 2025-03-10 ENCOUNTER — Inpatient Hospital Stay
